# Patient Record
Sex: Male | Born: 1975 | Race: White | Hispanic: No | Smoking: Current some day smoker
Health system: Southern US, Community
[De-identification: ages and names within clinical notes are randomized; demographics above are authoritative.]

## PROBLEM LIST (undated history)

## (undated) DIAGNOSIS — Z789 Other specified health status: Secondary | ICD-10-CM

## (undated) HISTORY — PX: NO PAST SURGERIES: SHX2092

---

## 2019-03-08 ENCOUNTER — Other Ambulatory Visit: Payer: Self-pay | Admitting: Orthopedic Surgery

## 2019-03-08 DIAGNOSIS — S52122A Displaced fracture of head of left radius, initial encounter for closed fracture: Secondary | ICD-10-CM

## 2019-03-09 ENCOUNTER — Encounter (HOSPITAL_COMMUNITY): Payer: Self-pay | Admitting: Orthopedic Surgery

## 2019-03-09 ENCOUNTER — Ambulatory Visit
Admission: RE | Admit: 2019-03-09 | Discharge: 2019-03-09 | Disposition: A | Discharge status: Home / Self Care | Payer: Self-pay | Source: Ambulatory Visit | Attending: Orthopedic Surgery | Admitting: Orthopedic Surgery

## 2019-03-09 ENCOUNTER — Ambulatory Visit
Admission: RE | Admit: 2019-03-09 | Discharge: 2019-03-09 | Disposition: A | Discharge status: Home / Self Care | Payer: Worker's Compensation | Source: Ambulatory Visit | Attending: Orthopedic Surgery | Admitting: Orthopedic Surgery

## 2019-03-09 ENCOUNTER — Other Ambulatory Visit (HOSPITAL_COMMUNITY)
Admission: RE | Admit: 2019-03-09 | Discharge: 2019-03-09 | Disposition: A | Discharge status: Home / Self Care | Payer: Worker's Compensation | Source: Ambulatory Visit | Attending: Orthopedic Surgery | Admitting: Orthopedic Surgery

## 2019-03-09 ENCOUNTER — Other Ambulatory Visit: Payer: Self-pay

## 2019-03-09 DIAGNOSIS — S52122A Displaced fracture of head of left radius, initial encounter for closed fracture: Secondary | ICD-10-CM

## 2019-03-09 LAB — SARS CORONAVIRUS 2 (TAT 6-24 HRS): SARS Coronavirus 2: NEGATIVE

## 2019-03-09 NOTE — Progress Notes (Signed)
Geoffrey Fields denies chest pain or shortness of breath. Patient had Covid test today and it is pending.  I instructed patient that he may have a light breakfast before 0830- dry toast - noting greasy and that he may have clear liquids until 1200.  I went over what clear liquids, patient said he drinks soda.

## 2019-03-10 ENCOUNTER — Inpatient Hospital Stay (HOSPITAL_COMMUNITY): Payer: Self-pay

## 2019-03-10 ENCOUNTER — Inpatient Hospital Stay (HOSPITAL_COMMUNITY)
Admission: RE | Admit: 2019-03-10 | Discharge: 2019-03-13 | DRG: 500 | Disposition: A | Discharge status: Home / Self Care | Payer: Worker's Compensation | Attending: Orthopedic Surgery | Admitting: Orthopedic Surgery

## 2019-03-10 ENCOUNTER — Inpatient Hospital Stay (HOSPITAL_COMMUNITY): Payer: Worker's Compensation | Attending: Anesthesiology

## 2019-03-10 ENCOUNTER — Ambulatory Visit (HOSPITAL_COMMUNITY): Payer: Worker's Compensation | Admitting: Anesthesiology

## 2019-03-10 ENCOUNTER — Observation Stay (HOSPITAL_COMMUNITY): Payer: Worker's Compensation

## 2019-03-10 ENCOUNTER — Encounter (HOSPITAL_COMMUNITY)
Admission: RE | Disposition: A | Discharge status: Home / Self Care | Payer: Self-pay | Source: Home / Self Care | Attending: Orthopedic Surgery

## 2019-03-10 ENCOUNTER — Encounter (HOSPITAL_COMMUNITY): Payer: Self-pay | Admitting: Orthopedic Surgery

## 2019-03-10 DIAGNOSIS — Z01818 Encounter for other preprocedural examination: Secondary | ICD-10-CM

## 2019-03-10 DIAGNOSIS — J9601 Acute respiratory failure with hypoxia: Secondary | ICD-10-CM | POA: Diagnosis not present

## 2019-03-10 DIAGNOSIS — F17210 Nicotine dependence, cigarettes, uncomplicated: Secondary | ICD-10-CM | POA: Diagnosis present

## 2019-03-10 DIAGNOSIS — S42402A Unspecified fracture of lower end of left humerus, initial encounter for closed fracture: Secondary | ICD-10-CM | POA: Diagnosis present

## 2019-03-10 DIAGNOSIS — S5332XA Traumatic rupture of left ulnar collateral ligament, initial encounter: Secondary | ICD-10-CM | POA: Diagnosis present

## 2019-03-10 DIAGNOSIS — W19XXXA Unspecified fall, initial encounter: Secondary | ICD-10-CM | POA: Diagnosis present

## 2019-03-10 DIAGNOSIS — R918 Other nonspecific abnormal finding of lung field: Secondary | ICD-10-CM

## 2019-03-10 DIAGNOSIS — Y9389 Activity, other specified: Secondary | ICD-10-CM

## 2019-03-10 DIAGNOSIS — S42452A Displaced fracture of lateral condyle of left humerus, initial encounter for closed fracture: Principal | ICD-10-CM | POA: Diagnosis present

## 2019-03-10 DIAGNOSIS — I959 Hypotension, unspecified: Secondary | ICD-10-CM | POA: Diagnosis not present

## 2019-03-10 DIAGNOSIS — Z20828 Contact with and (suspected) exposure to other viral communicable diseases: Secondary | ICD-10-CM | POA: Diagnosis present

## 2019-03-10 DIAGNOSIS — S52122A Displaced fracture of head of left radius, initial encounter for closed fracture: Secondary | ICD-10-CM | POA: Diagnosis present

## 2019-03-10 DIAGNOSIS — J811 Chronic pulmonary edema: Secondary | ICD-10-CM

## 2019-03-10 DIAGNOSIS — Z880 Allergy status to penicillin: Secondary | ICD-10-CM

## 2019-03-10 DIAGNOSIS — J9602 Acute respiratory failure with hypercapnia: Secondary | ICD-10-CM | POA: Diagnosis not present

## 2019-03-10 DIAGNOSIS — Y990 Civilian activity done for income or pay: Secondary | ICD-10-CM

## 2019-03-10 DIAGNOSIS — J988 Other specified respiratory disorders: Secondary | ICD-10-CM | POA: Diagnosis present

## 2019-03-10 DIAGNOSIS — Z8614 Personal history of Methicillin resistant Staphylococcus aureus infection: Secondary | ICD-10-CM

## 2019-03-10 DIAGNOSIS — S53022A Posterior subluxation of left radial head, initial encounter: Secondary | ICD-10-CM | POA: Diagnosis present

## 2019-03-10 DIAGNOSIS — Y929 Unspecified place or not applicable: Secondary | ICD-10-CM

## 2019-03-10 DIAGNOSIS — J9690 Respiratory failure, unspecified, unspecified whether with hypoxia or hypercapnia: Secondary | ICD-10-CM | POA: Diagnosis present

## 2019-03-10 DIAGNOSIS — J95821 Acute postprocedural respiratory failure: Secondary | ICD-10-CM | POA: Diagnosis not present

## 2019-03-10 HISTORY — PX: RADIAL HEAD ARTHROPLASTY: SHX6044

## 2019-03-10 HISTORY — DX: Other specified health status: Z78.9

## 2019-03-10 LAB — CBC
HCT: 40.9 % (ref 39.0–52.0)
HCT: 38 % — ABNORMAL LOW (ref 39.0–52.0)
Hemoglobin: 14.2 g/dL (ref 13.0–17.0)
Hemoglobin: 12.6 g/dL — ABNORMAL LOW (ref 13.0–17.0)
MCH: 30.8 pg (ref 26.0–34.0)
MCH: 30.2 pg (ref 26.0–34.0)
MCHC: 34.7 g/dL (ref 30.0–36.0)
MCHC: 33.2 g/dL (ref 30.0–36.0)
MCV: 88.7 fL (ref 80.0–100.0)
MCV: 91.1 fL (ref 80.0–100.0)
Platelets: 253 10*3/uL (ref 150–400)
Platelets: 234 10*3/uL (ref 150–400)
RBC: 4.61 MIL/uL (ref 4.22–5.81)
RBC: 4.17 MIL/uL — ABNORMAL LOW (ref 4.22–5.81)
RDW: 12.9 % (ref 11.5–15.5)
RDW: 12.8 % (ref 11.5–15.5)
WBC: 11.1 10*3/uL — ABNORMAL HIGH (ref 4.0–10.5)
WBC: 15.5 10*3/uL — ABNORMAL HIGH (ref 4.0–10.5)
nRBC: 0 % (ref 0.0–0.2)
nRBC: 0 % (ref 0.0–0.2)

## 2019-03-10 LAB — POCT I-STAT 7, (LYTES, BLD GAS, ICA,H+H)
Acid-base deficit: 2 mmol/L (ref 0.0–2.0)
Bicarbonate: 26.9 mmol/L (ref 20.0–28.0)
Calcium, Ion: 1.18 mmol/L (ref 1.15–1.40)
HCT: 39 % (ref 39.0–52.0)
Hemoglobin: 13.3 g/dL (ref 13.0–17.0)
O2 Saturation: 76 %
Potassium: 4.3 mmol/L (ref 3.5–5.1)
Sodium: 134 mmol/L — ABNORMAL LOW (ref 135–145)
TCO2: 29 mmol/L (ref 22–32)
pCO2 arterial: 66.1 mmHg (ref 32.0–48.0)
pH, Arterial: 7.218 — ABNORMAL LOW (ref 7.350–7.450)
pO2, Arterial: 51 mmHg — ABNORMAL LOW (ref 83.0–108.0)

## 2019-03-10 LAB — HIV ANTIBODY (ROUTINE TESTING W REFLEX): HIV Screen 4th Generation wRfx: NONREACTIVE

## 2019-03-10 LAB — CREATININE, SERUM
Creatinine, Ser: 1.03 mg/dL (ref 0.61–1.24)
GFR calc Af Amer: >60 mL/min (ref >60)
GFR calc non Af Amer: >60 mL/min (ref >60)

## 2019-03-10 SURGERY — ARTHROPLASTY, RADIUS, HEAD
Anesthesia: General | Laterality: Left

## 2019-03-10 MED ORDER — ACETAMINOPHEN 160 MG/5ML PO SOLN: 325.0000 mg | ORAL | Status: DC | PRN

## 2019-03-10 MED ORDER — ONDANSETRON HCL 4 MG/2ML IJ SOLN
INTRAMUSCULAR | Status: DC | PRN
  Administered 2019-03-10: 4 mg via INTRAVENOUS

## 2019-03-10 MED ORDER — DEXMEDETOMIDINE HCL IN NACL 80 MCG/20ML IV SOLN
INTRAVENOUS | Status: AC
  Filled 2019-03-10: qty 20

## 2019-03-10 MED ORDER — LACTATED RINGERS IV SOLN
INTRAVENOUS | Status: DC
  Administered 2019-03-10 (×3): via INTRAVENOUS

## 2019-03-10 MED ORDER — CEFAZOLIN SODIUM-DEXTROSE 1-4 GM/50ML-% IV SOLN
1.0000 g | Freq: Three times a day (TID) | INTRAVENOUS | Status: DC
  Administered 2019-03-11 – 2019-03-12 (×4): 1 g via INTRAVENOUS
  Filled 2019-03-10 (×6): qty 50

## 2019-03-10 MED ORDER — PROPOFOL 10 MG/ML IV BOLUS
INTRAVENOUS | Status: DC | PRN
  Administered 2019-03-10: 50 mg via INTRAVENOUS
  Administered 2019-03-10: 200 mg via INTRAVENOUS

## 2019-03-10 MED ORDER — ALBUMIN HUMAN 5 % IV SOLN
INTRAVENOUS | Status: AC
  Filled 2019-03-10: qty 250

## 2019-03-10 MED ORDER — MIDAZOLAM HCL 2 MG/2ML IJ SOLN
INTRAMUSCULAR | Status: AC
  Filled 2019-03-10: qty 2

## 2019-03-10 MED ORDER — OXYCODONE HCL 5 MG PO TABS
10.0000 mg | ORAL_TABLET | ORAL | Status: DC | PRN
  Administered 2019-03-11: 20:00:00 10 mg via ORAL
  Administered 2019-03-12: 15 mg via ORAL
  Administered 2019-03-12: 01:00:00 10 mg via ORAL
  Administered 2019-03-12 – 2019-03-13 (×2): 15 mg via ORAL
  Filled 2019-03-10: qty 2
  Filled 2019-03-10: qty 3
  Filled 2019-03-10: qty 2
  Filled 2019-03-10: qty 3
  Filled 2019-03-10: qty 2
  Filled 2019-03-10: qty 3

## 2019-03-10 MED ORDER — LIDOCAINE 2% (20 MG/ML) 5 ML SYRINGE
INTRAMUSCULAR | Status: DC | PRN
  Administered 2019-03-10: 80 mg via INTRAVENOUS

## 2019-03-10 MED ORDER — 0.9 % SODIUM CHLORIDE (POUR BTL) OPTIME
TOPICAL | Status: DC | PRN
  Administered 2019-03-10: 16:00:00 1000 mL

## 2019-03-10 MED ORDER — METHOCARBAMOL 500 MG PO TABS: 500.0000 mg | ORAL_TABLET | Freq: Four times a day (QID) | ORAL | Status: DC | PRN

## 2019-03-10 MED ORDER — EPINEPHRINE 1 MG/10ML IJ SOSY
PREFILLED_SYRINGE | INTRAMUSCULAR | Status: DC | PRN
  Administered 2019-03-10 (×2): .01 mg via INTRAVENOUS

## 2019-03-10 MED ORDER — FUROSEMIDE 10 MG/ML IJ SOLN
INTRAMUSCULAR | Status: AC
  Filled 2019-03-10: qty 4

## 2019-03-10 MED ORDER — BUPIVACAINE HCL (PF) 0.25 % IJ SOLN
INTRAMUSCULAR | Status: AC
  Filled 2019-03-10: qty 30

## 2019-03-10 MED ORDER — NOREPINEPHRINE 4 MG/250ML-% IV SOLN
0.0000 ug/min | INTRAVENOUS | Status: DC
  Administered 2019-03-10: 2 ug/min via INTRAVENOUS
  Filled 2019-03-10: qty 250

## 2019-03-10 MED ORDER — ACETAMINOPHEN 325 MG PO TABS: 325.0000 mg | ORAL_TABLET | ORAL | Status: DC | PRN

## 2019-03-10 MED ORDER — OXYCODONE HCL 5 MG/5ML PO SOLN: 5.0000 mg | Freq: Once | ORAL | Status: DC | PRN

## 2019-03-10 MED ORDER — OXYCODONE HCL 5 MG PO TABS: 5.0000 mg | ORAL_TABLET | Freq: Once | ORAL | Status: DC | PRN

## 2019-03-10 MED ORDER — OXYCODONE HCL 5 MG PO TABS
5.0000 mg | ORAL_TABLET | ORAL | Status: DC | PRN
  Administered 2019-03-11 – 2019-03-13 (×4): 10 mg via ORAL
  Filled 2019-03-10 (×3): qty 2

## 2019-03-10 MED ORDER — ONDANSETRON HCL 4 MG/2ML IJ SOLN
4.0000 mg | Freq: Four times a day (QID) | INTRAMUSCULAR | Status: DC | PRN
  Administered 2019-03-11: 16:00:00 4 mg via INTRAVENOUS
  Filled 2019-03-10: qty 2

## 2019-03-10 MED ORDER — ALBUTEROL SULFATE HFA 108 (90 BASE) MCG/ACT IN AERS
INHALATION_SPRAY | RESPIRATORY_TRACT | Status: DC | PRN
  Administered 2019-03-10 (×2): 3 via RESPIRATORY_TRACT
  Administered 2019-03-10: 6 via RESPIRATORY_TRACT

## 2019-03-10 MED ORDER — PROPOFOL 10 MG/ML IV BOLUS
INTRAVENOUS | Status: AC
  Filled 2019-03-10: qty 20

## 2019-03-10 MED ORDER — ACETAMINOPHEN 325 MG PO TABS
325.0000 mg | ORAL_TABLET | Freq: Four times a day (QID) | ORAL | Status: DC | PRN
  Administered 2019-03-11: 325 mg via ORAL
  Administered 2019-03-12 – 2019-03-13 (×5): 650 mg via ORAL
  Filled 2019-03-10 (×3): qty 2
  Filled 2019-03-10: qty 1
  Filled 2019-03-10 (×3): qty 2

## 2019-03-10 MED ORDER — IPRATROPIUM-ALBUTEROL 0.5-2.5 (3) MG/3ML IN SOLN
3.0000 mL | RESPIRATORY_TRACT | Status: DC
  Administered 2019-03-10: 3 mL via RESPIRATORY_TRACT

## 2019-03-10 MED ORDER — MEPERIDINE HCL 25 MG/ML IJ SOLN: 6.2500 mg | INTRAMUSCULAR | Status: DC | PRN

## 2019-03-10 MED ORDER — FENTANYL CITRATE (PF) 100 MCG/2ML IJ SOLN
INTRAMUSCULAR | Status: AC
  Administered 2019-03-10: 50 ug via INTRAVENOUS
  Filled 2019-03-10: qty 2

## 2019-03-10 MED ORDER — PROMETHAZINE HCL 12.5 MG RE SUPP
12.5000 mg | Freq: Four times a day (QID) | RECTAL | Status: DC | PRN
  Filled 2019-03-10: qty 1

## 2019-03-10 MED ORDER — IPRATROPIUM-ALBUTEROL 0.5-2.5 (3) MG/3ML IN SOLN
RESPIRATORY_TRACT | Status: AC
  Filled 2019-03-10: qty 3

## 2019-03-10 MED ORDER — PROPOFOL 1000 MG/100ML IV EMUL
0.0000 ug/kg/min | INTRAVENOUS | Status: DC
  Administered 2019-03-10: 10 ug/kg/min via INTRAVENOUS
  Administered 2019-03-11: 11.374 ug/kg/min via INTRAVENOUS
  Filled 2019-03-10 (×2): qty 100

## 2019-03-10 MED ORDER — DEXMEDETOMIDINE HCL IN NACL 200 MCG/50ML IV SOLN
INTRAVENOUS | Status: DC | PRN
  Administered 2019-03-10: .7 ug/kg/h via INTRAVENOUS

## 2019-03-10 MED ORDER — FENTANYL CITRATE (PF) 100 MCG/2ML IJ SOLN
25.0000 ug | INTRAMUSCULAR | Status: DC | PRN
  Administered 2019-03-10: 20:00:00 50 ug via INTRAVENOUS

## 2019-03-10 MED ORDER — ESMOLOL HCL 100 MG/10ML IV SOLN
INTRAVENOUS | Status: DC | PRN
  Administered 2019-03-10: 30 ug via INTRAVENOUS
  Administered 2019-03-10: 20 ug via INTRAVENOUS

## 2019-03-10 MED ORDER — HYDROMORPHONE HCL 1 MG/ML IJ SOLN: 0.5000 mg | INTRAMUSCULAR | Status: DC | PRN

## 2019-03-10 MED ORDER — ACETAMINOPHEN 500 MG PO TABS
1000.0000 mg | ORAL_TABLET | Freq: Four times a day (QID) | ORAL | Status: AC
  Administered 2019-03-11: 12:00:00 1000 mg via ORAL
  Filled 2019-03-10: qty 2

## 2019-03-10 MED ORDER — MIDAZOLAM HCL 5 MG/5ML IJ SOLN
INTRAMUSCULAR | Status: DC | PRN
  Administered 2019-03-10: 2 mg via INTRAVENOUS

## 2019-03-10 MED ORDER — FAMOTIDINE IN NACL 20-0.9 MG/50ML-% IV SOLN
20.0000 mg | Freq: Two times a day (BID) | INTRAVENOUS | Status: DC
  Administered 2019-03-10 – 2019-03-12 (×4): 20 mg via INTRAVENOUS
  Filled 2019-03-10 (×5): qty 50

## 2019-03-10 MED ORDER — ALBUMIN HUMAN 5 % IV SOLN
12.5000 g | Freq: Once | INTRAVENOUS | Status: AC
  Administered 2019-03-10: 22:00:00 12.5 g via INTRAVENOUS

## 2019-03-10 MED ORDER — ALPRAZOLAM 0.5 MG PO TABS
0.5000 mg | ORAL_TABLET | Freq: Four times a day (QID) | ORAL | Status: DC | PRN
  Administered 2019-03-11: 16:00:00 0.5 mg via ORAL
  Filled 2019-03-10: qty 1

## 2019-03-10 MED ORDER — VITAMIN C 500 MG PO TABS
1000.0000 mg | ORAL_TABLET | Freq: Every day | ORAL | Status: DC
  Administered 2019-03-12 – 2019-03-13 (×2): 1000 mg via ORAL
  Filled 2019-03-10: qty 2

## 2019-03-10 MED ORDER — DEXAMETHASONE SODIUM PHOSPHATE 10 MG/ML IJ SOLN
INTRAMUSCULAR | Status: DC | PRN
  Administered 2019-03-10: 10 mg via INTRAVENOUS

## 2019-03-10 MED ORDER — METOPROLOL TARTRATE 5 MG/5ML IV SOLN
INTRAVENOUS | Status: DC | PRN
  Administered 2019-03-10: 2.5 mg via INTRAVENOUS

## 2019-03-10 MED ORDER — BUPIVACAINE HCL (PF) 0.25 % IJ SOLN
INTRAMUSCULAR | Status: DC | PRN
  Administered 2019-03-10: 30 mL

## 2019-03-10 MED ORDER — ROCURONIUM BROMIDE 10 MG/ML (PF) SYRINGE
PREFILLED_SYRINGE | INTRAVENOUS | Status: DC | PRN
  Administered 2019-03-10: 50 mg via INTRAVENOUS
  Administered 2019-03-10: 20 mg via INTRAVENOUS
  Administered 2019-03-10: 30 mg via INTRAVENOUS

## 2019-03-10 MED ORDER — FENTANYL CITRATE (PF) 100 MCG/2ML IJ SOLN
50.0000 ug | INTRAMUSCULAR | Status: DC | PRN
  Administered 2019-03-11: 08:00:00 50 ug via INTRAVENOUS
  Filled 2019-03-10: qty 2

## 2019-03-10 MED ORDER — ONDANSETRON HCL 4 MG PO TABS: 4.0000 mg | ORAL_TABLET | Freq: Four times a day (QID) | ORAL | Status: DC | PRN

## 2019-03-10 MED ORDER — ALBUTEROL SULFATE HFA 108 (90 BASE) MCG/ACT IN AERS
INHALATION_SPRAY | RESPIRATORY_TRACT | Status: AC
  Filled 2019-03-10: qty 6.7

## 2019-03-10 MED ORDER — DEXMEDETOMIDINE HCL IN NACL 400 MCG/100ML IV SOLN
0.4000 ug/kg/h | INTRAVENOUS | Status: DC
  Administered 2019-03-10: 1.2 ug/kg/h via INTRAVENOUS
  Filled 2019-03-10: qty 100

## 2019-03-10 MED ORDER — FENTANYL CITRATE (PF) 100 MCG/2ML IJ SOLN
INTRAMUSCULAR | Status: DC | PRN
  Administered 2019-03-10: 100 ug via INTRAVENOUS
  Administered 2019-03-10 (×2): 50 ug via INTRAVENOUS

## 2019-03-10 MED ORDER — ENOXAPARIN SODIUM 60 MG/0.6ML ~~LOC~~ SOLN
50.0000 mg | SUBCUTANEOUS | Status: DC
  Administered 2019-03-11 – 2019-03-13 (×3): 50 mg via SUBCUTANEOUS
  Filled 2019-03-10 (×2): qty 0.5
  Filled 2019-03-10 (×3): qty 0.6
  Filled 2019-03-10: qty 0.5

## 2019-03-10 MED ORDER — SODIUM CHLORIDE (PF) 0.9 % IJ SOLN
INTRAMUSCULAR | Status: AC
  Filled 2019-03-10: qty 10

## 2019-03-10 MED ORDER — FENTANYL CITRATE (PF) 250 MCG/5ML IJ SOLN
INTRAMUSCULAR | Status: AC
  Filled 2019-03-10: qty 5

## 2019-03-10 MED ORDER — PHENYLEPHRINE 40 MCG/ML (10ML) SYRINGE FOR IV PUSH (FOR BLOOD PRESSURE SUPPORT)
PREFILLED_SYRINGE | INTRAVENOUS | Status: DC | PRN
  Administered 2019-03-10: 80 ug via INTRAVENOUS

## 2019-03-10 MED ORDER — SUCCINYLCHOLINE CHLORIDE 200 MG/10ML IV SOSY
PREFILLED_SYRINGE | INTRAVENOUS | Status: DC | PRN
  Administered 2019-03-10: 110 mg via INTRAVENOUS

## 2019-03-10 MED ORDER — CHLORHEXIDINE GLUCONATE 4 % EX LIQD: 60.0000 mL | Freq: Once | CUTANEOUS | Status: DC

## 2019-03-10 MED ORDER — FUROSEMIDE 10 MG/ML IJ SOLN
20.0000 mg | Freq: Once | INTRAMUSCULAR | Status: AC
  Administered 2019-03-10: 20 mg via INTRAVENOUS
  Filled 2019-03-10: qty 2

## 2019-03-10 MED ORDER — FENTANYL CITRATE (PF) 100 MCG/2ML IJ SOLN
50.0000 ug | INTRAMUSCULAR | Status: DC | PRN
  Administered 2019-03-11: 06:00:00 100 ug via INTRAVENOUS
  Administered 2019-03-11 (×2): 50 ug via INTRAVENOUS
  Administered 2019-03-11 (×2): 100 ug via INTRAVENOUS
  Filled 2019-03-10 (×5): qty 2

## 2019-03-10 MED ORDER — DOCUSATE SODIUM 100 MG PO CAPS
100.0000 mg | ORAL_CAPSULE | Freq: Two times a day (BID) | ORAL | Status: DC
  Administered 2019-03-11 – 2019-03-13 (×4): 100 mg via ORAL
  Filled 2019-03-10 (×4): qty 1

## 2019-03-10 MED ORDER — CEFAZOLIN SODIUM-DEXTROSE 2-4 GM/100ML-% IV SOLN
2.0000 g | INTRAVENOUS | Status: AC
  Administered 2019-03-10: 2 g via INTRAVENOUS
  Filled 2019-03-10: qty 100

## 2019-03-10 MED ORDER — CEFAZOLIN SODIUM-DEXTROSE 1-4 GM/50ML-% IV SOLN
1.0000 g | INTRAVENOUS | Status: AC
  Administered 2019-03-11: 1 g via INTRAVENOUS
  Filled 2019-03-10: qty 50

## 2019-03-10 MED ORDER — LACTATED RINGERS IV SOLN
INTRAVENOUS | Status: DC
  Administered 2019-03-11 – 2019-03-13 (×3): via INTRAVENOUS

## 2019-03-10 MED ORDER — FAMOTIDINE 20 MG PO TABS
20.0000 mg | ORAL_TABLET | Freq: Two times a day (BID) | ORAL | Status: DC | PRN
  Filled 2019-03-10 (×2): qty 1

## 2019-03-10 MED ORDER — EPINEPHRINE 1 MG/10ML IJ SOSY
PREFILLED_SYRINGE | INTRAMUSCULAR | Status: AC
  Filled 2019-03-10: qty 10

## 2019-03-10 MED ORDER — METHOCARBAMOL 1000 MG/10ML IJ SOLN
500.0000 mg | Freq: Four times a day (QID) | INTRAVENOUS | Status: DC | PRN
  Filled 2019-03-10: qty 5

## 2019-03-10 MED ORDER — ONDANSETRON HCL 4 MG/2ML IJ SOLN: 4.0000 mg | Freq: Once | INTRAMUSCULAR | Status: DC | PRN

## 2019-03-10 SURGICAL SUPPLY — 61 items
BLADE LONG MED 31MMX9MM (MISCELLANEOUS) ×1
BLADE LONG MED 31X9 (MISCELLANEOUS) ×2 IMPLANT
BNDG COHESIVE 4X5 TAN STRL (GAUZE/BANDAGES/DRESSINGS) ×3 IMPLANT
BNDG ELASTIC 3X5.8 VLCR STR LF (GAUZE/BANDAGES/DRESSINGS) IMPLANT
BNDG ELASTIC 4X5.8 VLCR STR LF (GAUZE/BANDAGES/DRESSINGS) ×12 IMPLANT
BNDG ESMARK 4X9 LF (GAUZE/BANDAGES/DRESSINGS) ×3 IMPLANT
BNDG GAUZE ELAST 4 BULKY (GAUZE/BANDAGES/DRESSINGS) ×6 IMPLANT
BNDG STRETCH 4X75 NS LF (GAUZE/BANDAGES/DRESSINGS) ×9 IMPLANT
CORD BIPOLAR FORCEPS 12FT (ELECTRODE) ×3 IMPLANT
COVER SURGICAL LIGHT HANDLE (MISCELLANEOUS) ×3 IMPLANT
COVER WAND RF STERILE (DRAPES) IMPLANT
CUFF TOURN SGL QUICK 18X4 (TOURNIQUET CUFF) IMPLANT
CUFF TOURN SGL QUICK 24 (TOURNIQUET CUFF) ×2
CUFF TRNQT CYL 24X4X16.5-23 (TOURNIQUET CUFF) ×1 IMPLANT
DRAPE INCISE IOBAN 66X45 STRL (DRAPES) ×3 IMPLANT
DRAPE OEC MINIVIEW 54X84 (DRAPES) ×3 IMPLANT
DRSG EMULSION OIL 3X3 NADH (GAUZE/BANDAGES/DRESSINGS) ×6 IMPLANT
GAUZE SPONGE 4X4 12PLY STRL (GAUZE/BANDAGES/DRESSINGS) ×3 IMPLANT
GAUZE XEROFORM 1X8 LF (GAUZE/BANDAGES/DRESSINGS) IMPLANT
GAUZE XEROFORM 5X9 LF (GAUZE/BANDAGES/DRESSINGS) ×3 IMPLANT
GLOVE BIOGEL M 8.0 STRL (GLOVE) ×3 IMPLANT
GLOVE SS BIOGEL STRL SZ 8 (GLOVE) ×1 IMPLANT
GLOVE SUPERSENSE BIOGEL SZ 8 (GLOVE) ×2
GOWN STRL REUS W/ TWL LRG LVL3 (GOWN DISPOSABLE) ×2 IMPLANT
GOWN STRL REUS W/ TWL XL LVL3 (GOWN DISPOSABLE) ×3 IMPLANT
GOWN STRL REUS W/TWL LRG LVL3 (GOWN DISPOSABLE) ×4
GOWN STRL REUS W/TWL XL LVL3 (GOWN DISPOSABLE) ×6
HEAD RADIAL EXPLOR 14X24MM (Head) ×3 IMPLANT
IMPL STEM WITH SCREW 8X29 (Stem) ×1 IMPLANT
IMPL SWIVELOCK 3.5X13.5 (Anchor) ×1 IMPLANT
IMPLANT STEM WITH SCREW 8X29 (Stem) ×3 IMPLANT
IMPLANT SWIVELOCK 3.5X13.5 (Anchor) ×3 IMPLANT
KIT BASIN OR (CUSTOM PROCEDURE TRAY) ×3 IMPLANT
KIT SWIVELOCK DX 3.5X13.5 DISP (KITS) ×3 IMPLANT
KIT TURNOVER KIT B (KITS) ×3 IMPLANT
LOOP VESSEL MAXI BLUE (MISCELLANEOUS) ×3 IMPLANT
MANIFOLD NEPTUNE II (INSTRUMENTS) IMPLANT
NDL SUT 6 .5 CRC .975X.05 MAYO (NEEDLE) ×1 IMPLANT
NEEDLE HYPO 25GX1X1/2 BEV (NEEDLE) IMPLANT
NEEDLE MAYO TAPER (NEEDLE) ×2
NS IRRIG 1000ML POUR BTL (IV SOLUTION) ×3 IMPLANT
PACK ORTHO EXTREMITY (CUSTOM PROCEDURE TRAY) ×3 IMPLANT
PAD ARMBOARD 7.5X6 YLW CONV (MISCELLANEOUS) ×3 IMPLANT
PAD CAST 4YDX4 CTTN HI CHSV (CAST SUPPLIES) ×3 IMPLANT
PADDING CAST COTTON 4X4 STRL (CAST SUPPLIES) ×6
SOL PREP POV-IOD 4OZ 10% (MISCELLANEOUS) ×3 IMPLANT
SPLINT FIBERGLASS 3X35 (CAST SUPPLIES) ×3 IMPLANT
SPLINT FIBERGLASS 4X30 (CAST SUPPLIES) ×3 IMPLANT
SUT FIBERWIRE #2 38 T-5 BLUE (SUTURE) ×9
SUT PROLENE 3 0 PS 2 (SUTURE) ×21 IMPLANT
SUT PROLENE 4 0 PS 2 18 (SUTURE) IMPLANT
SUT VIC AB 2-0 CT1 27 (SUTURE)
SUT VIC AB 2-0 CT1 TAPERPNT 27 (SUTURE) IMPLANT
SUTURE FIBERWR #2 38 T-5 BLUE (SUTURE) ×3 IMPLANT
SYR CONTROL 10ML LL (SYRINGE) IMPLANT
TOWEL GREEN STERILE (TOWEL DISPOSABLE) ×6 IMPLANT
TOWEL GREEN STERILE FF (TOWEL DISPOSABLE) ×3 IMPLANT
TUBE CONNECTING 12'X1/4 (SUCTIONS)
TUBE CONNECTING 12X1/4 (SUCTIONS) IMPLANT
UNDERPAD 30X30 (UNDERPADS AND DIAPERS) ×3 IMPLANT
WATER STERILE IRR 1000ML POUR (IV SOLUTION) IMPLANT

## 2019-03-10 NOTE — Op Note (Signed)
Operative note 03/10/2019  Dominica Severin MD.  Preoperative diagnosis left elbow fracture subluxation/dislocation with comminuted radial head fracture and posterior capitellum avulsion injury with lateral ulnar collateral ligament tear  Postop diagnosis the same  Operative procedure #1 left elbow evaluation under anesthesia #2 left elbow radial head resection and radial head arthroplasty using a Biomet 8 mm x 28 mm in length stem and a 14 x 24 mm implant with locking screw #3 complex lateral ulnar collateral ligament repair left elbow #4 excision posterior capitellum chip fragments secondary to capitellum fracture #5 stress radiography and 6 view radiographic series performed examined and interpreted by myself left elbow  Anesthesia block with general anesthetic  Surgeon Dominica Severin  Estimated blood loss minimal  Tourniquet time just over an hour  Indications for procedure: This patient is a pleasant 43 year old male who had an on-the-job injury.  He had a violent fall and complains of pain in his left elbow.  He was seen in my office and was reduced.  He has comminuted radial head fracture with posterior capitellum fracture and lateral ulnar collateral ligament insufficiency.  I feel he had a complete dislocation most likely and partially reduced.  I reduced the subluxation in the office and obtained a CT scan which reveals the abnormalities.  He presents for surgical reconstruction.  Operative procedure: Patient was seen by myself and anesthesia he was counseled extensively in the holding area.  Preoperative block was placed.  He has a penicillin allergy which is a rash but tolerated Ancef well.  He was taken to the operative theater underwent preoperative Ancef and I then supervised and performed the evaluation under anesthesia and prep and drape myself with Hibiclens scrub followed by 10-minute surgical Betadine scrub and paint we carefully handled the arm.  Once this was complete the  sterile field was secured and timeout was observed.  The operation commenced with an extensile lateral incision about the elbow.  I used the interval of Kocher to wean the anconeus and ECU after initial skin incision and dissection.  I made sure the planes were carefully protected.  The patient had obvious large amount of damage to the area.  At this juncture you could visually see the lateral ulnar collateral ligament tear.  The radial head was fragmented and there were portions of the radial head and posterior capitellum region chip fractures in the posterior elbow joint.  These fragments were removed.  Unfortunately he had some extension into the radial shaft but a fairly reasonable stability in terms of the shaft for radial head arthroplasty.  He was not a candidate unfortunately for direct ORIF of the radial head and neck.  The comminuted pieces were removed at this time I then placed an oscillating saw followed by planar about the radial shaft.  I did place the arm in pronation and protected the radial nerve at all times.  I irrigated copiously.  The patient's coronoid was intact and the anterior capsule was torn.  This was notable and the area irrigated.  I then trialed the patient to a size 8 mm x 28 mm in length stem.  I made sure not to [facility] the joint with trial prosthetic devices and make sure looked at this under x-ray.  We settled on a 8 mm x 28 mm Biomet stem with a 14 x 24 mm head for the implant.  This was placed in standard fashion.  I made sure that this was not overstuffed and secured the screw in the area.  Patient tolerated this nicely.  Following this we then very carefully and cautiously placed patient through range of motion took x-rays which looked excellent.  I should note that prior to the implant placement I placed a #2 FiberWire in a modified Krakw stitch in the elbow about the lateral ulnar collateral ligament.  I then placed an additional #2 FiberWire in a  similar pattern.  This left for exiting strands for repair of the lateral ulnar collateral ligament.  With the implant and and x-rays looking good I then tensioned the repair and found the point of isometry about the capitellum region.  This looked very good in my opinion in terms of its stability and I then placed an Arthrex 3.5 push lock for purposes of a secure fit.  I checked this under x-ray as well and following securing the lateral collateral ligament repair the patient had excellent stability.  I made sure that the posterior capitellum was stable and it was and removed all bony fragments and debris from the olecranon and the anterior chamber of the elbow.  At multiple points during the operation I checked the stability as I wanted to be very precise and meticulous with the repair.  I felt the lateral repair was excellent.  There was some medial ulnar collateral ligament laxity but there is no gross instability with pronation supination and short arc flexion and extension.  He was stable to extension at 40 degrees and I did not place him beyond this.  I feel the medial side injury should heal.  I should note I made it a point to not overstuff him as the radial shaft was fairly comminuted in one particular area and thus it was not my intent to have a forceful press-fit of the implant that could possibly propagate the fracture but to make sure we had a good solid secure fit and allowed for a secondary lateral elbow stabilizer to be recreated with the radial head arthroplasty implant.  Following this I closed the fascial interval and imbricated the lateral ulnar collateral ligament repair.  This was performed without difficulty.  The tourniquet was deflated and the skin was closed with 3-0 Prolene.  Compartments were soft refill excellent and there were no complications.  He will be monitored in the recovery room and will plan for an overnight stay for IV antibiotics pain control and other  measures.  In terms of his rehabilitative efforts we will plan for immobilization the first 4 weeks followed by progressive flexion and extension in the office in a controlled fashion with bracing.  All questions have been encouraged and answered.  Roseanne Kaufman MD

## 2019-03-10 NOTE — Anesthesia Procedure Notes (Signed)
Anesthesia Regional Block: Supraclavicular block   Pre-Anesthetic Checklist: ,, timeout performed, Correct Patient, Correct Site, Correct Laterality, Correct Procedure, Correct Position, site marked, Risks and benefits discussed,  Surgical consent,  Pre-op evaluation,  At surgeon's request and post-op pain management  Laterality: Left  Prep: chloraprep       Needles:  Injection technique: Single-shot  Needle Type: Echogenic Stimulator Needle     Needle Length: 9cm  Needle Gauge: 21     Additional Needles:   Procedures:,,,, ultrasound used (permanent image in chart),,,,  Narrative:  Start time: 03/10/2019 4:00 PM End time: 03/10/2019 4:05 PM Injection made incrementally with aspirations every 5 mL.  Performed by: Personally  Anesthesiologist: Lidia Collum, MD  Additional Notes: Monitors applied. Injection made in 5cc increments. No resistance to injection. Good needle visualization. Patient tolerated procedure well.

## 2019-03-10 NOTE — H&P (Signed)
Geoffrey Fields is an 43 y.o. male.   Chief Complaint: Fracture dislocation left elbow HPI: Patient presents for definitive surgical care of his fracture dislocation left elbow.  He denies prior injury.  He had an on-the-job injury with fracture dislocation confirmed by x-ray and CT scan.  We are planning surgical reconstruction.   Patient presents for evaluation and treatment of the of their upper extremity predicament. The patient denies neck, back, chest or  abdominal pain. The patient notes that they have no lower extremity problems. The patients primary complaint is noted. We are planning surgical care pathway for the upper extremity.  Past Medical History:  Diagnosis Date  . Medical history non-contributory     Past Surgical History:  Procedure Laterality Date  . NO PAST SURGERIES      History reviewed. No pertinent family history. Social History:  reports that he has been smoking. He has never used smokeless tobacco. He reports previous alcohol use. He reports previous drug use.  Allergies:  Allergies  Allergen Reactions  . Penicillins Rash    Did it involve swelling of the face/tongue/throat, SOB, or low BP? No Did it involve sudden or severe rash/hives, skin peeling, or any reaction on the inside of your mouth or nose? No Did you need to seek medical attention at a hospital or doctor's office? No When did it last happen?Childhood If all above answers are "NO", may proceed with cephalosporin use.    No medications prior to admission.    Results for orders placed or performed during the hospital encounter of 03/09/19 (from the past 48 hour(s))  SARS CORONAVIRUS 2 (TAT 6-24 HRS) Nasopharyngeal Nasopharyngeal Swab     Status: None   Collection Time: 03/09/19  2:27 PM   Specimen: Nasopharyngeal Swab  Result Value Ref Range   SARS Coronavirus 2 NEGATIVE NEGATIVE    Comment: (NOTE) SARS-CoV-2 target nucleic acids are NOT DETECTED. The SARS-CoV-2 RNA is generally  detectable in upper and lower respiratory specimens during the acute phase of infection. Negative results do not preclude SARS-CoV-2 infection, do not rule out co-infections with other pathogens, and should not be used as the sole basis for treatment or other patient management decisions. Negative results must be combined with clinical observations, patient history, and epidemiological information. The expected result is Negative. Fact Sheet for Patients: SugarRoll.be Fact Sheet for Healthcare Providers: https://www.woods-mathews.com/ This test is not yet approved or cleared by the [place] FDA and  has been authorized for detection and/or diagnosis of SARS-CoV-2 by FDA under an Emergency Use Authorization (EUA). This EUA will remain  in effect (meaning this test can be used) for the duration of the COVID-19 declaration under Section 56 4(b)(1) of the Act, 21 U.S.C. section 360bbb-3(b)(1), unless the authorization is terminated or revoked sooner. Performed at [facility], [place] [address], [place], [place] [address]    CT ELBOW LEFT WO CONTRAST  Result Date: 03/09/2019 CLINICAL DATA:  Left elbow fracture EXAM: CT OF THE UPPER LEFT EXTREMITY WITHOUT CONTRAST TECHNIQUE: Multidetector CT imaging of the upper left extremity was performed according to the standard protocol. 3D reformatted images were created on an independent workstation. COMPARISON:  None available. FINDINGS: Bones/Joint/Cartilage Comminuted fracture involving the anterior aspect of the radial head and neck. Dominant 2.8 x 1.2 cm fracture fragment which includes approximately 40% of the radial head articular surface is displaced proximally and interposed within the radiocapitellar joint (series 1007, image 28). The overall alignment of the radiocapitellar joint is near anatomic.  There is an acute fracture of the more posterior aspect of the capitellum with a 1.6 x 0.7 cm  minimally displaced fracture fragment (series 11, image 65). Alignment of the ulna and trochlea is anatomic without dislocation. No fracture is visualized of the proximal ulna. There is a large elbow hemarthrosis with a few tiny intra-articular fracture fragments. Ligaments Suboptimally assessed by CT. Muscles and Tendons Triceps tendon and biceps tendons appear grossly intact. No focal muscular abnormality. Soft tissues Prominent soft tissue swelling and hemorrhage at the posterior aspect of the elbow. IMPRESSION: 1. Comminuted fracture involving the anterior aspect of the radial head and neck. Dominant 2.8 x 1.2 cm fracture fragment which includes approximately 40% of the radial head articular surface is displaced proximally and interposed within the radiocapitellar joint. 2. Acute fracture of the more posterior aspect of the capitellum with a 1.6 x 0.7 cm minimally displaced fracture fragment. 3. Large elbow hemarthrosis with a few tiny intra-articular fracture fragments. 4. Prominent soft tissue swelling and hemorrhage at the posterior aspect of the elbow. Electronically Signed   By: Duanne Guess M.D.   On: 03/09/2019 11:04   CT 3D INDEPENDENT WKST  Result Date: 03/09/2019 CLINICAL DATA:  Left elbow fracture EXAM: CT OF THE UPPER LEFT EXTREMITY WITHOUT CONTRAST TECHNIQUE: Multidetector CT imaging of the upper left extremity was performed according to the standard protocol. 3D reformatted images were created on an independent workstation. COMPARISON:  None available. FINDINGS: Bones/Joint/Cartilage Comminuted fracture involving the anterior aspect of the radial head and neck. Dominant 2.8 x 1.2 cm fracture fragment which includes approximately 40% of the radial head articular surface is displaced proximally and interposed within the radiocapitellar joint (series 1007, image 28). The overall alignment of the radiocapitellar joint is near anatomic. There is an acute fracture of the more posterior aspect of  the capitellum with a 1.6 x 0.7 cm minimally displaced fracture fragment (series 11, image 65). Alignment of the ulna and trochlea is anatomic without dislocation. No fracture is visualized of the proximal ulna. There is a large elbow hemarthrosis with a few tiny intra-articular fracture fragments. Ligaments Suboptimally assessed by CT. Muscles and Tendons Triceps tendon and biceps tendons appear grossly intact. No focal muscular abnormality. Soft tissues Prominent soft tissue swelling and hemorrhage at the posterior aspect of the elbow. IMPRESSION: 1. Comminuted fracture involving the anterior aspect of the radial head and neck. Dominant 2.8 x 1.2 cm fracture fragment which includes approximately 40% of the radial head articular surface is displaced proximally and interposed within the radiocapitellar joint. 2. Acute fracture of the more posterior aspect of the capitellum with a 1.6 x 0.7 cm minimally displaced fracture fragment. 3. Large elbow hemarthrosis with a few tiny intra-articular fracture fragments. 4. Prominent soft tissue swelling and hemorrhage at the posterior aspect of the elbow. Electronically Signed   By: Duanne Guess M.D.   On: 03/09/2019 11:04    Review of Systems  Constitutional: Negative.   HENT: Negative.   Eyes: Negative.   Respiratory: Negative.   Cardiovascular: Negative.     Height 5\' 11"  (1.803 m), weight 108.9 kg. Physical Exam  Fracture dislocation left elbow reduced in my office.  CT confirms the malalignment and displaced radial head and capitellar fractures.  We will plan for surgical reconstruction.  He is sensate.  He has no signs of infection or dystrophy and no signs of vascular compromise.  Fortunately he has no compartment syndrome but does have a very swollen elbow.  The patient is  alert and oriented in no acute distress. The patient complains of pain in the affected upper extremity.  The patient is noted to have a normal HEENT exam. Lung fields show equal  chest expansion and no shortness of breath. Abdomen exam is nontender without distention. Lower extremity examination does not show any fracture dislocation or blood clot symptoms. Pelvis is stable and the neck and back are stable and nontender. Assessment/Plan  We will plan for surgical reconstruction left elbow fracture subluxation with ORIF as necessary ligament repair is necessary and radial head arthroplasty.  I discussed all issues with the patient.  We are planning surgery for your upper extremity. The risk and benefits of surgery to include risk of bleeding, infection, anesthesia,  damage to normal structures and failure of the surgery to accomplish its intended goals of relieving symptoms and restoring function have been discussed in detail. With this in mind we plan to proceed. I have specifically discussed with the patient the pre-and postoperative regime and the dos and don'ts and risk and benefits in great detail. Risk and benefits of surgery also include risk of dystrophy(CRPS), chronic nerve pain, failure of the healing process to go onto completion and other inherent risks of surgery The relavent the pathophysiology of the disease/injury process, as well as the alternatives for treatment and postoperative course of action has been discussed in great detail with the patient who desires to proceed.  We will do everything in our power to help you (the patient) restore function to the upper extremity. It is a pleasure to see this patient today.   Oletta CohnWilliam M Cate Oravec III, MD 03/10/2019, 9:45 AM

## 2019-03-10 NOTE — Consult Note (Signed)
NAME:  Geoffrey Fields, MRN:  [id number], DOB:  03-Sep-1975, LOS: 0 ADMISSION DATE:  03/10/2019, CONSULTATION DATE:  03/10/19 REFERRING MD:  Dr. Amanda PeaGramig, CHIEF COMPLAINT:  Respiratory failure   Brief History   43 y.o. M who had on L elbow fracture at work on 03/08/19, he underwent surgical repair today and failed extubation, therefore PCCM consulted for ICU transfer.   History of present illness   Geoffrey Fields is a 43 y.o. M who has PMH of MRSA and tobacco use who sustained a comminuted L capitellar and radial head and neck fx fracture and subluxation with hemarthrosis on 12/9 requiring surgical repair which he underwent today.  Post-op, he failed extubation and PCCM consulted for ICU admission and ventilator management  Past Medical History  Tobacco use, MRSA   Significant Hospital Events   12/11 Admit to Orthopedic surgery, post-op PCCM consult  Consults:  PCCM  Procedures:  12/11 ETT 12/11 Surgical reconstruction of the L elbow  Significant Diagnostic Tests:  12/11  CXR>>Diffuse bilateral hazy airspace opacities. These are of unknown clinical significance and may be secondary to developing pulmonary edema or aspiration  Micro Data:  12/10 Sars-CoV-2>>negative  Antimicrobials:  Ancef 12/11-   Interim history/subjective:  Pt failed extubation post-op, transfer to ICU on full vent support  Objective   Blood pressure 134/73, pulse 99, temperature 98.2 F (36.8 C), resp. rate 20, height 5\' 11"  (1.803 m), weight 109.9 kg, SpO2 100 %.    Vent Mode: PRVC FiO2 (%):  [100 %] 100 % Set Rate:  [16 bmp] 16 bmp Vt Set:  [600 mL] 600 mL PEEP:  [8 cmH20] 8 cmH20 Plateau Pressure:  [22 cmH20] 22 cmH20   Intake/Output Summary (Last 24 hours) at 03/10/2019 2013 Last data filed at 03/10/2019 1908 Gross per 24 hour  Intake 2000 ml  Output 25 ml  Net 1975 ml   Filed Weights   03/09/19 1904 03/10/19 1424  Weight: 108.9 kg 109.9 kg    General:  Middle aged man sedated and  ventilated HEENT: MM pink/moist Neuro: opens eyes to voice CV: s1s2 rrr, no m/r/g PULM:   GI: soft, bsx4 active  Extremities: warm/dry,  edema  Skin: no rashes or lesions   Resolved Hospital Problem list     Assessment & Plan:   Acute hypoxic and hypercarbic respiratory failure  -Post-anesthesia -CXR today with possible developing infiltrates vs edema, given Lasix 20mg  in PACU P:  -follow repeat CXR tomorrow, monitor for fever, leukocytosis and signs of infection -repeat ABG pending -Maintain full vent support with SAT/SBT as tolerated -titrate Vent setting to maintain SpO2 greater than or equal to 90%. -HOB elevated 30 degrees. -Plateau pressures less than 30 cm H20.  -Bronchial hygiene and RT/bronchodilator protocol. -sedation with Precedex and Fentanyl   Comminuted L radial head and shaft fracture s/p surgical repair -management per orthopedic surgery -pain control as needed      Best practice:  Diet: NPO Pain/Anxiety/Delirium protocol (if indicated): Precedex/Fentanyl VAP protocol (if indicated): yes DVT prophylaxis: lovenox GI prophylaxis: pepcid Glucose control: n/a Mobility: bed rest Code Status: full Family Communication: per primary Disposition: ICU  Labs   CBC: Recent Labs  Lab 03/10/19 1420 03/10/19 1927  WBC 11.1*  --   HGB 14.2 13.3  HCT 40.9 39.0  MCV 88.7  --   PLT 253  --     Basic Metabolic Panel: Recent Labs  Lab 03/10/19 1927  NA 134*  K 4.3   GFR: CrCl cannot be calculated (  No successful lab value found.). Recent Labs  Lab 03/10/19 1420  WBC 11.1*    Liver Function Tests: No results for input(s): AST, ALT, ALKPHOS, BILITOT, PROT, ALBUMIN in the last 168 hours. No results for input(s): LIPASE, AMYLASE in the last 168 hours. No results for input(s): AMMONIA in the last 168 hours.  ABG    Component Value Date/Time   PHART 7.218 (L) 03/10/2019 1927   PCO2ART 66.1 (HH) 03/10/2019 1927   PO2ART 51.0 (L) 03/10/2019  1927   HCO3 26.9 03/10/2019 1927   TCO2 29 03/10/2019 1927   ACIDBASEDEF 2.0 03/10/2019 1927   O2SAT 76.0 03/10/2019 1927     Coagulation Profile: No results for input(s): INR, PROTIME in the last 168 hours.  Cardiac Enzymes: No results for input(s): CKTOTAL, CKMB, CKMBINDEX, TROPONINI in the last 168 hours.  HbA1C: No results found for: HGBA1C  CBG: No results for input(s): GLUCAP in the last 168 hours.  Review of Systems:   Unable to obtain secondary to intubated/sedated  Past Medical History  He,  has a past medical history of Medical history non-contributory.   Surgical History    Past Surgical History:  Procedure Laterality Date  . NO PAST SURGERIES       Social History   reports that he has been smoking. He has never used smokeless tobacco. He reports previous alcohol use. He reports previous drug use.   Family History   His family history is not on file.   Allergies Allergies  Allergen Reactions  . Penicillins Rash    Did it involve swelling of the face/tongue/throat, SOB, or low BP? No Did it involve sudden or severe rash/hives, skin peeling, or any reaction on the inside of your mouth or nose? No Did you need to seek medical attention at a hospital or doctor's office? No When did it last happen?Childhood If all above answers are "NO", may proceed with cephalosporin use.     Home Medications  Prior to Admission medications   Medication Sig Start Date End Date Taking? Authorizing Provider  methocarbamol (ROBAXIN) 500 MG tablet Take 500 mg by mouth every 6 (six) hours as needed for muscle spasms.   Yes [provider]  oxycodone (OXY-IR) 5 MG capsule Take 5 mg by mouth every 4 (four) hours as needed for pain.   Yes [provider]     Critical care time: 50 minutes    CRITICAL CARE Performed by: Otilio Carpen Gleason   Total critical care time: 50 minutes  Critical care time was exclusive of separately billable procedures and  treating other patients.  Critical care was necessary to treat or prevent imminent or life-threatening deterioration.  Critical care was time spent personally by me on the following activities: development of treatment plan with patient and/or surrogate as well as nursing, discussions with consultants, evaluation of patient's response to treatment, examination of patient, obtaining history from patient or surrogate, ordering and performing treatments and interventions, ordering and review of laboratory studies, ordering and review of radiographic studies, pulse oximetry and re-evaluation of patient's condition.  Otilio Carpen Gleason, PA-C [facility]  Pager# [phone], if no answer [phone]   Patient seen in concert with PA Gleason, discussed case examined at bedside together and discussed critical care planning.  Currently hypotensive and CXR with pulmonary edema. Will order echo for in the morning.  Will repeat CXR this pm to confirm tube position post repositioning.  Most likely has undiagnosed OSA, ? negative pressure pulmonary edema.  Will support pressure with levophed, hopefully can wean off in next few hours.

## 2019-03-10 NOTE — Anesthesia Postprocedure Evaluation (Signed)
Anesthesia Post Note  Patient: Geoffrey Fields  Procedure(s) Performed: Radial head arthroplasty with radial head excision left elbow and ligamentous repair reconstruction as necessary (Left )     Patient location during evaluation: PACU Anesthesia Type: General Level of consciousness: sedated Pain management: pain level controlled Vital Signs Assessment: post-procedure vital signs reviewed and stable Respiratory status: patient remains intubated per anesthesia plan Cardiovascular status: stable Postop Assessment: no apparent nausea or vomiting Anesthetic complications: yes Anesthetic complication details: required intubation and respiratory eventComments: Patient able to diuresis in PACU. Oxygenation improving. Able to wean FiO2. Likely negative pressure pulmonary edema.     Last Vitals:  Vitals:   03/10/19 2130 03/10/19 2139  BP:  (!) 92/53  Pulse: (!) 103 (!) 103  Resp: 20 (!) 22  Temp: 36.9 C   SpO2:  98%    Last Pain:  Vitals:   03/10/19 1432  TempSrc:   PainSc: 6                  Geoffrey Fields Geoffrey Fields Geoffrey Fields

## 2019-03-10 NOTE — Transfer of Care (Signed)
Immediate Anesthesia Transfer of Care Note  Patient: Geoffrey Fields  Procedure(s) Performed: Radial head arthroplasty with radial head excision left elbow and ligamentous repair reconstruction as necessary (Left )  Patient Location: PACU  Anesthesia Type:General  Level of Consciousness: sedated  Airway & Oxygen Therapy: Patient re-intubated and Patient placed on Ventilator (see vital sign flow sheet for setting)  Post-op Assessment: Report given to RN and Post -op Vital signs reviewed and stable  Post vital signs: Reviewed and stable  Last Vitals:  Vitals Value Taken Time  BP 101/52 03/10/19 1958  Temp    Pulse 113 03/10/19 2001  Resp 12 03/10/19 2001  SpO2 95 % 03/10/19 2001  Vitals shown include unvalidated device data.  Last Pain:  Vitals:   03/10/19 1432  TempSrc:   PainSc: 6       Patients Stated Pain Goal: 3 (42/68/34 1962)  Complications: No apparent anesthesia complications

## 2019-03-10 NOTE — Anesthesia Preprocedure Evaluation (Signed)
Anesthesia Evaluation  Patient identified by MRN, date of birth, ID band Patient awake    Reviewed: Allergy & Precautions, NPO status , Patient's Chart, lab work & pertinent test results  History of Anesthesia Complications Negative for: history of anesthetic complications  Airway Mallampati: II  TM Distance: >3 FB Neck ROM: Full    Dental  (+) Teeth Intact   Pulmonary Current Smoker and Patient abstained from smoking.,    Pulmonary exam normal        Cardiovascular negative cardio ROS Normal cardiovascular exam     Neuro/Psych negative neurological ROS  negative psych ROS   GI/Hepatic negative GI ROS, Neg liver ROS,   Endo/Other  negative endocrine ROS  Renal/GU negative Renal ROS  negative genitourinary   Musculoskeletal negative musculoskeletal ROS (+)   Abdominal   Peds  Hematology negative hematology ROS (+)   Anesthesia Other Findings   Reproductive/Obstetrics                           Anesthesia Physical Anesthesia Plan  ASA: II  Anesthesia Plan: General   Post-op Pain Management: GA combined w/ Regional for post-op pain   Induction: Intravenous  PONV Risk Score and Plan: 1 and Ondansetron, Dexamethasone, Treatment may vary due to age or medical condition and Midazolam  Airway Management Planned: Oral ETT and LMA  Additional Equipment: None  Intra-op Plan:   Post-operative Plan: Extubation in OR  Informed Consent: I have reviewed the patients History and Physical, chart, labs and discussed the procedure including the risks, benefits and alternatives for the proposed anesthesia with the patient or authorized representative who has indicated his/her understanding and acceptance.     Dental advisory given  Plan Discussed with:   Anesthesia Plan Comments:        Anesthesia Quick Evaluation

## 2019-03-10 NOTE — Anesthesia Procedure Notes (Signed)
Procedure Name: Intubation Date/Time: 03/10/2019 4:31 PM Performed by: Imagene Riches, CRNA Pre-anesthesia Checklist: Patient identified, Emergency Drugs available, Suction available and Patient being monitored Patient Re-evaluated:Patient Re-evaluated prior to induction Oxygen Delivery Method: Circle System Utilized Preoxygenation: Pre-oxygenation with 100% oxygen Induction Type: IV induction Ventilation: Oral airway inserted - appropriate to patient size and Mask ventilation without difficulty Laryngoscope Size: Miller and 2 Grade View: Grade II Tube type: Oral Tube size: 7.5 mm Number of attempts: 2 Airway Equipment and Method: Stylet and Oral airway Placement Confirmation: ETT inserted through vocal cords under direct vision,  positive ETCO2 and breath sounds checked- equal and bilateral Secured at: 22 cm Tube secured with: Tape Dental Injury: Teeth and Oropharynx as per pre-operative assessment  Comments: First DL with MAC 3, grade 2 view, esophageal ETT placement. ETT removed, patient mask ventilated until SpO2 improved. Second DL with miller 2, grade 1 view, ETT easily passed through cords.

## 2019-03-10 NOTE — Progress Notes (Signed)
Called back to OR for MD wanting to change the Ventilator setting. Changed to SIMV/PRVC/PS from [facility] Patient tolerating well. EtTube had to be withdrawn 2 cm per xray and radiology, from 23 to 21 cm at the lip. BBS heard post with better movement. Nebulizer given per order and now patient is awaiting bed on 4N19. Will continue to monitor.

## 2019-03-10 NOTE — Progress Notes (Signed)
Patient ID: Geoffrey Fields, male   DOB: 23-Oct-1975, 43 y.o.   MRN: [id number] Events noted Discussed with family Ladavia Lindenbaum MD

## 2019-03-11 ENCOUNTER — Observation Stay (HOSPITAL_COMMUNITY): Payer: Worker's Compensation

## 2019-03-11 ENCOUNTER — Inpatient Hospital Stay (HOSPITAL_COMMUNITY): Payer: Worker's Compensation

## 2019-03-11 DIAGNOSIS — Z8614 Personal history of Methicillin resistant Staphylococcus aureus infection: Secondary | ICD-10-CM | POA: Diagnosis not present

## 2019-03-11 DIAGNOSIS — J9601 Acute respiratory failure with hypoxia: Secondary | ICD-10-CM

## 2019-03-11 DIAGNOSIS — I959 Hypotension, unspecified: Secondary | ICD-10-CM | POA: Diagnosis not present

## 2019-03-11 DIAGNOSIS — J810 Acute pulmonary edema: Secondary | ICD-10-CM

## 2019-03-11 DIAGNOSIS — W19XXXA Unspecified fall, initial encounter: Secondary | ICD-10-CM | POA: Diagnosis present

## 2019-03-11 DIAGNOSIS — J988 Other specified respiratory disorders: Secondary | ICD-10-CM | POA: Diagnosis not present

## 2019-03-11 DIAGNOSIS — F17210 Nicotine dependence, cigarettes, uncomplicated: Secondary | ICD-10-CM | POA: Diagnosis present

## 2019-03-11 DIAGNOSIS — S53022A Posterior subluxation of left radial head, initial encounter: Secondary | ICD-10-CM | POA: Diagnosis present

## 2019-03-11 DIAGNOSIS — S52122A Displaced fracture of head of left radius, initial encounter for closed fracture: Secondary | ICD-10-CM | POA: Diagnosis present

## 2019-03-11 DIAGNOSIS — S42452A Displaced fracture of lateral condyle of left humerus, initial encounter for closed fracture: Secondary | ICD-10-CM | POA: Diagnosis present

## 2019-03-11 DIAGNOSIS — S5332XA Traumatic rupture of left ulnar collateral ligament, initial encounter: Secondary | ICD-10-CM | POA: Diagnosis present

## 2019-03-11 DIAGNOSIS — J9602 Acute respiratory failure with hypercapnia: Secondary | ICD-10-CM

## 2019-03-11 DIAGNOSIS — Y929 Unspecified place or not applicable: Secondary | ICD-10-CM | POA: Diagnosis not present

## 2019-03-11 DIAGNOSIS — Y9389 Activity, other specified: Secondary | ICD-10-CM | POA: Diagnosis not present

## 2019-03-11 DIAGNOSIS — J95821 Acute postprocedural respiratory failure: Secondary | ICD-10-CM | POA: Diagnosis not present

## 2019-03-11 DIAGNOSIS — Y990 Civilian activity done for income or pay: Secondary | ICD-10-CM | POA: Diagnosis not present

## 2019-03-11 DIAGNOSIS — Z20828 Contact with and (suspected) exposure to other viral communicable diseases: Secondary | ICD-10-CM | POA: Diagnosis present

## 2019-03-11 DIAGNOSIS — Z880 Allergy status to penicillin: Secondary | ICD-10-CM | POA: Diagnosis not present

## 2019-03-11 DIAGNOSIS — S42402A Unspecified fracture of lower end of left humerus, initial encounter for closed fracture: Secondary | ICD-10-CM | POA: Diagnosis present

## 2019-03-11 LAB — ECHOCARDIOGRAM COMPLETE
Height: 71 in
Weight: 4098.79 oz

## 2019-03-11 LAB — TRIGLYCERIDES: Triglycerides: 51 mg/dL (ref <150)

## 2019-03-11 LAB — MRSA PCR SCREENING: MRSA by PCR: NEGATIVE

## 2019-03-11 MED ORDER — CHLORHEXIDINE GLUCONATE 0.12% ORAL RINSE (MEDLINE KIT)
15.0000 mL | Freq: Two times a day (BID) | OROMUCOSAL | Status: DC
  Administered 2019-03-11 (×2): 15 mL via OROMUCOSAL

## 2019-03-11 MED ORDER — CHLORHEXIDINE GLUCONATE CLOTH 2 % EX PADS
6.0000 | MEDICATED_PAD | Freq: Every day | CUTANEOUS | Status: DC
  Administered 2019-03-11 – 2019-03-12 (×2): 6 via TOPICAL

## 2019-03-11 MED ORDER — LABETALOL HCL 5 MG/ML IV SOLN
INTRAVENOUS | Status: AC
  Filled 2019-03-11: qty 4

## 2019-03-11 MED ORDER — CHLORHEXIDINE GLUCONATE 0.12 % MT SOLN
15.0000 mL | Freq: Two times a day (BID) | OROMUCOSAL | Status: DC
  Administered 2019-03-11 – 2019-03-13 (×3): 15 mL via OROMUCOSAL
  Filled 2019-03-11 (×4): qty 15

## 2019-03-11 MED ORDER — ORAL CARE MOUTH RINSE
15.0000 mL | OROMUCOSAL | Status: DC
  Administered 2019-03-11 (×4): 15 mL via OROMUCOSAL

## 2019-03-11 MED ORDER — ORAL CARE MOUTH RINSE
15.0000 mL | Freq: Two times a day (BID) | OROMUCOSAL | Status: DC
  Administered 2019-03-11 – 2019-03-13 (×4): 15 mL via OROMUCOSAL

## 2019-03-11 NOTE — Procedures (Signed)
Extubation Procedure Note  Patient Details:   Name: Geoffrey Fields DOB: 24-Mar-1976 MRN: [id number]   Airway Documentation:    Vent end date: 03/11/19 Vent end time: 0900   Evaluation  O2 sats: stable throughout Complications: No apparent complications Patient did tolerate procedure well. Bilateral Breath Sounds: Clear  pateint able to speak Yes  Geoffrey Fields 03/11/2019, 9:01 AM

## 2019-03-11 NOTE — Progress Notes (Signed)
Patient ID: Geoffrey Fields, male   DOB: September 26, 1975, 43 y.o.   MRN: [id number] Patient seen and examined at bedside.  Patient is extubated.  I discussed all issues with critical care medicine.  At present juncture the patient looks quite well.  He is alert oriented and talkative.  He is intact to sensation and motor function about the left hand and has pain appropriate to the elbow reconstruction.  He is status post open reconstruction fracture dislocation to the elbow with radial head arthroplasty and ligamentous reconstruction.  I will call his family and update his progress.  He is in good spirits.  He will be in the intensive care unit the remainder of today and hopefully to the floor tomorrow.  Lower extremity examination is benign without signs of DVT.  He has sequential compression devices and understands the importance of ankle pumps.  I stressed the importance of finger motion and massage and other measures.  It is great to see him doing so well this morning.  Demontre Padin MD

## 2019-03-11 NOTE — Evaluation (Signed)
Occupational Therapy Evaluation Patient Details Name: Geoffrey BastaJeremy Stehlin MRN: [id number] DOB: 1975/05/27 Today's Date: 03/11/2019    History of Present Illness This 43 y.o male admitted for radial head arthroplasty with ligamentous repair and reconstruction due to Lt elbow fracture with subluxation and dislocation along with posterior capitellum avulsion injury with lateral ulnar collateral ligament tear.  Pt failed extubation post operatively and was admitted to ICU. Extubated 12/12.  PMH includes: MRSA, tobacco abuse   Clinical Impression   PTA patient independent and working. Admitted for above and limited by L UE pain and decreased functional use s/p surgery, decreased activity tolerance, impaired balance, and generalized weakness. Patient currently requires min assist for transfers, min-max assist for ADLs.  Educated on L UE precautions, sling wear schedule, elevation, use of ice and exercises.  On 2L supplemental O2 during session, trial of RA but pt dropping to mid 80s.  Patient will benefit from continued [facility] services while [facility] to optimize independence and safety with ADLs, IADLs and mobility.      Follow Up Recommendations  Home health OT;Supervision/Assistance - 24 hour    Equipment Recommendations  3 in 1 bedside commode    Recommendations for Other Services       Precautions / Restrictions Precautions Precautions: Fall Precaution Comments: watch 02 Required Braces or Orthoses: Sling Restrictions Weight Bearing Restrictions: Yes LUE Weight Bearing: Non weight bearing Other Position/Activity Restrictions: No WB restrictions in chart but assumed NWB      Mobility Bed Mobility Overal bed mobility: Needs Assistance Bed Mobility: Supine to Sit     Supine to sit: Mod assist;HOB elevated     General bed mobility comments: Assist with trunk to get to EOB. Increased time.  Transfers Overall transfer level: Needs assistance Equipment  used: 1 person hand held assist;None Transfers: Sit to/from Stand Sit to Stand: Min assist         General transfer comment: min assist to power up to standing, cueing for hand placement and safety    Balance Overall balance assessment: Needs assistance Sitting-balance support: Feet supported;No upper extremity supported Sitting balance-Leahy Scale: Good     Standing balance support: During functional activity;No upper extremity supported Standing balance-Leahy Scale: Fair                             ADL either performed or assessed with clinical judgement   ADL Overall ADL's : Needs assistance/impaired     Grooming: Minimal assistance;Sitting   Upper Body Bathing: Moderate assistance;Sitting   Lower Body Bathing: Moderate assistance;Sit to/from stand   Upper Body Dressing : Maximal assistance;Sitting   Lower Body Dressing: Maximal assistance;Sit to/from stand   Toilet Transfer: Minimal Engineer, waterassistance;BSC;Regular Toilet;Ambulation Toilet Transfer Details (indicate cue type and reason): 3:1 over commode  Toileting- Clothing Manipulation and Hygiene: Minimal assistance;Sit to/from stand       Functional mobility during ADLs: Minimal assistance;Min guard;Cueing for safety General ADL Comments: pt limited by L UE pain, impaired balance and decreased activity tolerance     Vision   Vision Assessment?: No apparent visual deficits     Perception     Praxis      Pertinent Vitals/Pain Pain Assessment: 0-10 Pain Score: 10-Worst pain ever Faces Pain Scale: Hurts little more Pain Location: LUE Pain Descriptors / Indicators: Sore;Operative site guarding Pain Intervention(s): Monitored during session;Repositioned;Limited activity within patient's tolerance     Hand Dominance Right  Extremity/Trunk Assessment Upper Extremity Assessment Upper Extremity Assessment: LUE deficits/detail LUE Deficits / Details: s/p elbow surgery, able to wiggle fingers but  limited due to pain; reports hypersensitive fingers   LUE: Unable to fully assess due to immobilization;Unable to fully assess due to pain LUE Sensation: (hypersensitive distally) LUE Coordination: decreased fine motor;decreased gross motor   Lower Extremity Assessment Lower Extremity Assessment: Defer to PT evaluation   Cervical / Trunk Assessment Cervical / Trunk Assessment: Normal   Communication Communication Communication: No difficulties   Cognition Arousal/Alertness: Awake/alert Behavior During Therapy: WFL for tasks assessed/performed Overall Cognitive Status: Within Functional Limits for tasks assessed                                 General Comments: A&Ox4, needs some repetition of cues to adhere to restrictions for LUE.   General Comments  Pt on 2L supplemental O2 via [place] during session, on RA droped to mid 80s but recovered well with 2L; Hr up to 127 bpm    Exercises     Shoulder Instructions      Home Living Family/patient expects to be discharged to:: Private residence Living Arrangements: Other relatives(cousin) Available Help at Discharge: Family;Available PRN/intermittently Type of Home: House Home Access: Stairs to enter [employer] of Steps: 4-5 Entrance Stairs-Rails: Right;Left Home Layout: One level     Bathroom Shower/Tub: Chief Strategy Officer: Standard     Home Equipment: None          Prior Functioning/Environment Level of Independence: Independent        Comments: Working with Music therapist. Drives.  Cooks/cleans. been sleeping sitting up on couch        OT Problem List: Decreased strength;Decreased range of motion;Decreased activity tolerance;Impaired balance (sitting and/or standing);Decreased coordination;Decreased safety awareness;Decreased knowledge of use of DME or AE;Decreased knowledge of precautions;Cardiopulmonary status limiting activity;Pain;Impaired UE functional  use;Obesity;Impaired sensation      OT Treatment/Interventions: Self-care/ADL training;DME and/or AE instruction;Therapeutic exercise;Therapeutic activities;Patient/family education;Balance training    OT Goals(Current goals can be found in the care plan section) Acute Rehab OT Goals Patient Stated Goal: to get this pain better OT Goal Formulation: With patient Time For Goal Achievement: 03/25/19 Potential to Achieve Goals: Good  OT Frequency: Min 2X/week   Barriers to D/C:            Co-evaluation PT/OT/SLP Co-Evaluation/Treatment: Yes Reason for Co-Treatment: For patient/therapist safety(extubated ~1 hr prior to session) PT goals addressed during session: Mobility/safety with mobility OT goals addressed during session: ADL's and self-care      AM-PAC OT "6 Clicks" Daily Activity     Outcome Measure Help from another person eating meals?: A Little Help from another person taking care of personal grooming?: A Little Help from another person toileting, which includes using toliet, bedpan, or urinal?: A Lot Help from another person bathing (including washing, rinsing, drying)?: A Lot Help from another person to put on and taking off regular upper body clothing?: A Lot Help from another person to put on and taking off regular lower body clothing?: A Lot 6 Click Score: 14   End of Session Equipment Utilized During Treatment: Other (comment);Oxygen(sling, 2L) Nurse Communication: Mobility status  Activity Tolerance: Patient tolerated treatment well Patient left: in chair;with call bell/phone within reach;with chair alarm set;with nursing/sitter in room  OT Visit Diagnosis: Other abnormalities of gait and mobility (R26.89);Pain Pain - Right/Left: Left Pain - part of  body: Arm                Time: 7290-2111 OT Time Calculation (min): 31 min Charges:  OT General Charges $OT Visit: 1 Visit OT Evaluation $OT Eval Moderate Complexity: 1 Mod  Jolaine Artist, OT Acute  Rehabilitation Services Pager [phone] Office [phone]   Delight Stare 03/11/2019, 1:17 PM

## 2019-03-11 NOTE — Progress Notes (Signed)
Assisted tele visit to patient with family member.  Tianna Baus M, RN  

## 2019-03-11 NOTE — Progress Notes (Signed)
NAME:  Geoffrey Fields, MRN:  [id number], DOB:  Feb 15, 1976, LOS: 1 ADMISSION DATE:  03/10/2019, CONSULTATION DATE: 03/10/2019 REFERRING MD: Dr. Amanda Pea, CHIEF COMPLAINT: Respiratory failure  Brief History   43 y.o. M who had on L elbow fracture at work on 03/08/19, he underwent surgical repair today and failed extubation, therefore PCCM consulted for ICU transfer.    History of present illness   Geoffrey Fields is a 43 y.o. M who has PMH of MRSA and tobacco use who sustained a comminuted L capitellar and radial head and neck fx fracture and subluxation with hemarthrosis on 12/9 requiring surgical repair which he underwent today.  Post-op, he failed extubation and PCCM consulted for ICU admission and ventilator management  Past Medical History  Tobacco use, MRSA   Significant Hospital Events   12/11 Admit to Orthopedic surgery, post-op PCCM consult  Consults:  PCCM  Procedures:  12/11 ETT 12/11 Surgical reconstruction of the L elbow  Significant Diagnostic Tests:  12/11  CXR>>Diffuse bilateral hazy airspace opacities. These are of unknown clinical significance and may be secondary to developing pulmonary edema or aspiration  Micro Data:  12/10 Sars-CoV-2>>negative  Antimicrobials:  Ancef 12/11  Interim history/subjective:  Failed extubation postop transferred to ICU for vent support Is awake and alert this morning Weaning well  Objective   Blood pressure 132/88, pulse (!) 111, temperature 98.9 F (37.2 C), temperature source Axillary, resp. rate (!) 23, height 5\' 11"  (1.803 m), weight 116.2 kg, SpO2 100 %.    Vent Mode: CPAP FiO2 (%):  [40 %-100 %] 40 % Set Rate:  [16 bmp] 16 bmp Vt Set:  [600 mL] 600 mL PEEP:  [5 cmH20-8 cmH20] 5 cmH20 Pressure Support:  [5 cmH20-10 cmH20] 5 cmH20 Plateau Pressure:  [18 cmH20-22 cmH20] 18 cmH20   Intake/Output Summary (Last 24 hours) at 03/11/2019 0848 Last data filed at 03/11/2019 0800 Gross per 24 hour  Intake 3005.91 ml  Output  1900 ml  Net 1105.91 ml   Filed Weights   03/09/19 1904 03/10/19 1424 03/11/19 0500  Weight: 108.9 kg 109.9 kg 116.2 kg    Examination: General: Middle-aged gentleman, does not appear to be in respiratory distress HENT: Moist oral mucosa, endotracheal tube in place Lungs: Decreased air entry at the bases, clear breath sounds anteriorly Cardiovascular: S1-S2 appreciated, no murmur Abdomen: Soft, bowel sounds appreciated Extremities: No clubbing, no lower extremity edema Neuro: Awake and alert, interactive, moving all extremities GU:   Resolved Hospital Problem list     Assessment & Plan:  Acute hypoxic and hypercapnic respiratory failure Postoperative respiratory failure -Hypoventilated lung fields on chest x-ray -Weaning as tolerated -Bronchial hygiene -Off sedation and awake and alert  Comminuted left radial head and shaft fracture s/p surgical repair -Further management per orthopedic surgery -Pain management as needed  Patient is weaning well -We will extubate -Continue to monitor closely -Likelihood of failure considered   Best practice:  Diet: N.p.o., will advance as tolerated Pain/Anxiety/Delirium protocol (if indicated): Precedex/fentanyl-on hold VAP protocol (if indicated): In place DVT prophylaxis: Lovenox GI prophylaxis: Pepcid Glucose control: Not applicable Mobility: Bedrest Code Status: Full code Family Communication: Per primary Disposition: ICU  Labs   CBC: Recent Labs  Lab 03/10/19 1420 03/10/19 1927 03/10/19 2054  WBC 11.1*  --  15.5*  HGB 14.2 13.3 12.6*  HCT 40.9 39.0 38.0*  MCV 88.7  --  91.1  PLT 253  --  234    Basic Metabolic Panel: Recent Labs  Lab 03/10/19 1927 03/10/19  2054  NA 134*  --   K 4.3  --   CREATININE  --  1.03   GFR: Estimated Creatinine Clearance: 122.4 mL/min (by C-G formula based on SCr of 1.03 mg/dL). Recent Labs  Lab 03/10/19 1420 03/10/19 2054  WBC 11.1* 15.5*    Liver Function Tests: No  results for input(s): AST, ALT, ALKPHOS, BILITOT, PROT, ALBUMIN in the last 168 hours. No results for input(s): LIPASE, AMYLASE in the last 168 hours. No results for input(s): AMMONIA in the last 168 hours.  ABG    Component Value Date/Time   PHART 7.218 (L) 03/10/2019 1927   PCO2ART 66.1 (HH) 03/10/2019 1927   PO2ART 51.0 (L) 03/10/2019 1927   HCO3 26.9 03/10/2019 1927   TCO2 29 03/10/2019 1927   ACIDBASEDEF 2.0 03/10/2019 1927   O2SAT 76.0 03/10/2019 1927     Coagulation Profile: No results for input(s): INR, PROTIME in the last 168 hours.  Cardiac Enzymes: No results for input(s): CKTOTAL, CKMB, CKMBINDEX, TROPONINI in the last 168 hours.  HbA1C: No results found for: HGBA1C  CBG: No results for input(s): GLUCAP in the last 168 hours.  Review of Systems:   Patient is awake and alert Denies pain or discomfort Denies underlying lung disease  Past Medical History  He,  has a past medical history of Medical history non-contributory.   Surgical History    Past Surgical History:  Procedure Laterality Date  . NO PAST SURGERIES       Social History   reports that he has been smoking. He has never used smokeless tobacco. He reports previous alcohol use. He reports previous drug use.   Family History   His family history is not on file.   Allergies Allergies  Allergen Reactions  . Penicillins Rash    Did it involve swelling of the face/tongue/throat, SOB, or low BP? No Did it involve sudden or severe rash/hives, skin peeling, or any reaction on the inside of your mouth or nose? No Did you need to seek medical attention at a hospital or doctor's office? No When did it last happen?Childhood If all above answers are "NO", may proceed with cephalosporin use.     Home Medications  Prior to Admission medications   Medication Sig Start Date End Date Taking? Authorizing Provider  methocarbamol (ROBAXIN) 500 MG tablet Take 500 mg by mouth every 6 (six) hours as  needed for muscle spasms.   Yes [provider]  oxycodone (OXY-IR) 5 MG capsule Take 5 mg by mouth every 4 (four) hours as needed for pain.   Yes [provider]     The patient is critically ill with multiple organ systems failure and requires high complexity decision making for assessment and support, frequent evaluation and titration of therapies, application of advanced monitoring technologies and extensive interpretation of multiple databases. Critical Care Time devoted to patient care services described in this note independent of APP/resident time (if applicable)  is 30 minutes.   Sherrilyn Rist MD [facility] Personal pager: [phone] If unanswered, please page CCM On-call: [phone]

## 2019-03-11 NOTE — Progress Notes (Signed)
*  PRELIMINARY RESULTS* Echocardiogram 2D Echocardiogram has been performed.  Geoffrey Fields 03/11/2019, 2:53 PM

## 2019-03-11 NOTE — Evaluation (Addendum)
Physical Therapy Evaluation Patient Details Name: Geoffrey Fields MRN: [id number] DOB: January 01, 1976 Today's Date: 03/11/2019   History of Present Illness  This 43 y.o male admitted for radial head arthroplasty with ligamentous repair and reconstruction due to Lt elbow fracture with subluxation and dislocation along with posterior capitellum avulsion injury with lateral ulnar collateral ligament tear.  Pt failed extubation post operatively and was admitted to ICU. Extubated 12/12.  PMH includes: MRSA, tobacco abuse  Clinical Impression  Patient presents with LUE pain, generalized weakness and impaired mobility s/p above. Pt reports being independent PTA and drives bulldozers for work. Today, pt requires Mod A for bed mobility, Min A for transfers and min guard for ambulation. Sp02 dropped on RA to mid 80s so donned 2L/min 02. Anticipate pt will progress quickly with mobility. Educated on exercises, positioning and precautions of LUE. Will follow acutely to maximize independence and mobility prior to return home.    Follow Up Recommendations Home health PT;Supervision for mobility/OOB    Equipment Recommendations  None recommended by PT    Recommendations for Other Services       Precautions / Restrictions Precautions Precautions: (P) Fall Precaution Comments: (P) watch 02 Required Braces or Orthoses: (P) Sling Restrictions Weight Bearing Restrictions: (P) Yes LUE Weight Bearing: (P) Non weight bearing Other Position/Activity Restrictions: (P) No WB restrictions in chart but assumed NWB      Mobility  Bed Mobility Overal bed mobility: Needs Assistance Bed Mobility: Supine to Sit     Supine to sit: Mod assist;HOB elevated     General bed mobility comments: Assist with trunk to get to EOB. Increased time.  Transfers Overall transfer level: Needs assistance Equipment used: 1 person hand held assist;None Transfers: Sit to/from Stand Sit to Stand: Min assist         General  transfer comment: Min A to power to standing from EOB x1, from [facility] x1, transferred to chair post ambulation.  Ambulation/Gait Ambulation/Gait assistance: Min guard Gait Distance (Feet): 120 Feet Assistive device: None Gait Pattern/deviations: Step-through pattern;Decreased stride length   Gait velocity interpretation: 1.31 - 2.62 ft/sec, indicative of limited community ambulator General Gait Details: Slow, mostly steady gait. Sp02 dropped to 75% on 2L/min 02 for a quick period and then recovered quickly after rest break and cues for pursed lip breathing.  Stairs            Wheelchair Mobility    Modified Rankin (Stroke Patients Only)       Balance Overall balance assessment: Needs assistance Sitting-balance support: Feet supported;No upper extremity supported Sitting balance-Leahy Scale: Good     Standing balance support: During functional activity Standing balance-Leahy Scale: Fair                               Pertinent Vitals/Pain Pain Assessment: (P) 0-10 Pain Score: (P) 10-Worst pain ever Faces Pain Scale: (P) Hurts little more Pain Location: (P) LUE Pain Descriptors / Indicators: (P) Sore;Operative site guarding Pain Intervention(s): (P) Monitored during session;Repositioned;Limited activity within patient's tolerance    Home Living Family/patient expects to be discharged to:: (P) Private residence Living Arrangements: (P) Other relatives(cousin) Available Help at Discharge: (P) Family;Available PRN/intermittently Type of Home: (P) House Home Access: (P) Stairs to enter Entrance Stairs-Rails: (P) Right;Left Entrance Stairs-Number of Steps: (P) 4-5 Home Layout: (P) One level Home Equipment: (P) None      Prior Function Level of Independence: (P) Independent  Comments: (P) Working with [employer]. Drives.  Cooks/cleans. been sleeping sitting up on couch     Hand Dominance   Dominant Hand: (P) Right     Extremity/Trunk Assessment   Upper Extremity Assessment Upper Extremity Assessment: (P) LUE deficits/detail    Lower Extremity Assessment Lower Extremity Assessment: Overall WFL for tasks assessed    Cervical / Trunk Assessment Cervical / Trunk Assessment: Normal  Communication   Communication: (P) No difficulties  Cognition Arousal/Alertness: (P) Awake/alert Behavior During Therapy: (P) WFL for tasks assessed/performed Overall Cognitive Status: (P) Within Functional Limits for tasks assessed                                 General Comments: (P) A&Ox4, needs some repetition of cues to adhere to restrictions for LUE.      General Comments General comments (skin integrity, edema, etc.): Sp02 dropped to mid 80s on RA during session so donned 2L/min 02. HR up to 127 bpm.    Exercises     Assessment/Plan    PT Assessment Patient needs continued PT services  PT Problem List Decreased mobility;Decreased safety awareness;Pain;Impaired sensation;Cardiopulmonary status limiting activity;Decreased skin integrity;Decreased cognition;Decreased knowledge of precautions;Decreased range of motion       PT Treatment Interventions Therapeutic activities;Gait training;Therapeutic exercise;Patient/family education;Balance training;Functional mobility training;Stair training    PT Goals (Current goals can be found in the Care Plan section)  Acute Rehab PT Goals Patient Stated Goal: to get this pain better PT Goal Formulation: With patient Time For Goal Achievement: 03/25/19 Potential to Achieve Goals: Fair    Frequency Min 3X/week   Barriers to discharge Decreased caregiver support      Co-evaluation PT/OT/SLP Co-Evaluation/Treatment: Yes Reason for Co-Treatment: (P) For patient/therapist safety(extubated ~1 hr prior to session) PT goals addressed during session: Mobility/safety with mobility OT goals addressed during session: (P) ADL's and self-care       AM-PAC  PT "6 Clicks" Mobility  Outcome Measure Help needed turning from your back to your side while in a flat bed without using bedrails?: A Little Help needed moving from lying on your back to sitting on the side of a flat bed without using bedrails?: A Lot Help needed moving to and from a bed to a chair (including a wheelchair)?: A Little Help needed standing up from a chair using your arms (e.g., wheelchair or bedside chair)?: A Little Help needed to walk in hospital room?: A Little Help needed climbing 3-5 steps with a railing? : A Little 6 Click Score: 17    End of Session Equipment Utilized During Treatment: Oxygen Activity Tolerance: Patient tolerated treatment well;Treatment limited secondary to medical complications (Comment)(drop in SP02, tachycardia) Patient left: in chair;with call bell/phone within reach;with chair alarm set Nurse Communication: Mobility status PT Visit Diagnosis: Pain Pain - Right/Left: Left Pain - part of body: Arm    Time: 1015-1045 PT Time Calculation (min) (ACUTE ONLY): 30 min   Charges:   PT Evaluation $PT Eval Moderate Complexity: 1 Mod          Marisa Severin, PT, DPT Acute Rehabilitation Services Pager [phone] Office [phone]      Geoffrey Fields 03/11/2019, 1:11 PM

## 2019-03-12 MED ORDER — BISACODYL 10 MG RE SUPP: 10.0000 mg | Freq: Once | RECTAL | Status: DC

## 2019-03-12 MED ORDER — FAMOTIDINE 20 MG PO TABS
20.0000 mg | ORAL_TABLET | Freq: Two times a day (BID) | ORAL | Status: DC
  Administered 2019-03-12 – 2019-03-13 (×2): 20 mg via ORAL
  Filled 2019-03-12 (×3): qty 1

## 2019-03-12 MED ORDER — POLYETHYLENE GLYCOL 3350 17 G PO PACK
17.0000 g | PACK | Freq: Once | ORAL | Status: AC
  Administered 2019-03-12: 17 g via ORAL
  Filled 2019-03-12: qty 1

## 2019-03-12 MED ORDER — SENNA 8.6 MG PO TABS
1.0000 | ORAL_TABLET | Freq: Every day | ORAL | Status: AC
  Administered 2019-03-12 – 2019-03-13 (×2): 8.6 mg via ORAL
  Filled 2019-03-12 (×3): qty 1

## 2019-03-12 NOTE — Progress Notes (Signed)
Patient ID: Geoffrey Fields, male   DOB: [birth year], 43 y.o.   MRN: [id number] Patient is seen and evaluated at bedside.  Patient is doing very well in regards to his left elbow.  He is intact to sensation and motor function about the hand and has no signs of compartment syndrome dystrophic reaction or infection.  I reviewed this at length and the findings.  Lower extremity examination shows normal motion motor function and no signs of DVT.  The patient and I discussed all issues.  Present time the left upper extremity is quite stable regarding the surgical reconstruction.  His admission due to respiratory decompression is improving nicely.  Hopefully he can go to the floor today and possibly discharge tomorrow.     I discussed with the patient that the next steps depend on his respiratory status and he understands this.  He has been counseled in regards to possible sleep apnea and work-up regarding this.  All questions have been encouraged and answered.  Pleasure to see him today.  Roseanne Kaufman cell phone [phone]

## 2019-03-12 NOTE — Progress Notes (Signed)
Patient now extubated, doing well in ICU. Post-operative anesthetic events from Friday night discussed with patient.  All questioned answered. Recommend outpatient evaluation for OSA.

## 2019-03-12 NOTE — Addendum Note (Signed)
Addendum  created 03/12/19 1312 by Murvin Natal, MD   Clinical Note Signed

## 2019-03-12 NOTE — Progress Notes (Signed)
NAME:  Geoffrey Fields, MRN:  [id number], DOB:  1975-04-19, LOS: 2 ADMISSION DATE:  03/10/2019, CONSULTATION DATE: 03/10/2019 REFERRING MD: Dr. Amedeo Plenty, CHIEF COMPLAINT: Respiratory failure  Brief History   43 y.o. M who had on L elbow fracture at work on 03/08/19, he underwent surgical repair today and failed extubation, therefore PCCM consulted for ICU transfer.   History of present illness   Geoffrey Fields is a 43 y.o. M who has PMH of MRSA and tobacco use who sustained a comminuted L capitellar and radial head and neck fx fracture and subluxation with hemarthrosis on 12/9 requiring surgical repair which he underwent today.  Post-op, he failed extubation and PCCM consulted for ICU admission and ventilator management  Past Medical History  Tobacco use, MRSA   Significant Hospital Events   12/11 Admit to Orthopedic surgery, post-op PCCM consult  Consults:  PCCM  Procedures:  12/11 ETT 12/11 Surgical reconstruction of the L elbow  Significant Diagnostic Tests:  12/11  CXR>>Diffuse bilateral hazy airspace opacities. These are of unknown clinical significance and may be secondary to developing pulmonary edema or aspiration  Micro Data:  12/10 Sars-CoV-2>>negative  Antimicrobials:  Ancef 12/11  Interim history/subjective:  Successfully extubated 03/11/2019 Noted to be desaturating overnight, was placed on oxygen Likely has some component of obstructive sleep apnea  Objective   Blood pressure 119/64, pulse (!) 105, temperature 98.4 F (36.9 C), temperature source Oral, resp. rate (!) 21, height 5\' 11"  (1.803 m), weight 117.5 kg, SpO2 96 %.        Intake/Output Summary (Last 24 hours) at 03/12/2019 1035 Last data filed at 03/12/2019 0900 Gross per 24 hour  Intake 1778.02 ml  Output 575 ml  Net 1203.02 ml   Filed Weights   03/10/19 1424 03/11/19 0500 03/12/19 0500  Weight: 109.9 kg 116.2 kg 117.5 kg    Examination: General: Middle-aged gentleman, does not look to be in  distress HENT: Moist oral mucosa Lungs: Decreased air entry at the bases, clear breath sounds Cardiovascular: S1-S2 appreciated Abdomen: Soft, bowel sounds appreciated Extremities: No clubbing, no edema Neuro: Awake and alert, interactive, moving all extremities GU:   Resolved Hospital Problem list     Assessment & Plan:  Acute hypoxic and hypercapnic respiratory failure Postoperative respiratory failure -Successfully extubated 03/11/2019 -Requiring supplemental oxygen occasionally  On discussions -Does have a history of snoring -No witnessed apnea -Body habitus supports a diagnosis of obstructive sleep apnea -Nocturnal desaturations also will be consistent with having obstructive sleep apnea -He will require a home sleep study -Can be scheduled to follow-up for further evaluation for OSA  Comminuted left radial head and shaft fracture s/p surgical repair -Further management per Ortho surgery -Pain management as needed -He is safe to be transferred to the floor today    Best practice:  Diet: Tolerating diet this morning Pain/Anxiety/Delirium protocol (if indicated): As needed pain medication VAP protocol (if indicated): Not applicable DVT prophylaxis: Lovenox GI prophylaxis: Pepcid Glucose control: Not applicable Mobility: Bedrest Code Status: Full code Family Communication: Per primary Disposition: Transfer to [place]: Recent Labs  Lab 03/10/19 1420 03/10/19 1927 03/10/19 2054  WBC 11.1*  --  15.5*  HGB 14.2 13.3 12.6*  HCT 40.9 39.0 38.0*  MCV 88.7  --  91.1  PLT 253  --  423    Basic Metabolic Panel: Recent Labs  Lab 03/10/19 1927 03/10/19 2054  NA 134*  --   K 4.3  --   CREATININE  --  1.03   GFR: Estimated Creatinine Clearance: 123.1 mL/min (by C-G formula based on SCr of 1.03 mg/dL). Recent Labs  Lab 03/10/19 1420 03/10/19 2054  WBC 11.1* 15.5*    Liver Function Tests: No results for input(s): AST, ALT, ALKPHOS, BILITOT,  PROT, ALBUMIN in the last 168 hours. No results for input(s): LIPASE, AMYLASE in the last 168 hours. No results for input(s): AMMONIA in the last 168 hours.  ABG    Component Value Date/Time   PHART 7.218 (L) 03/10/2019 1927   PCO2ART 66.1 (HH) 03/10/2019 1927   PO2ART 51.0 (L) 03/10/2019 1927   HCO3 26.9 03/10/2019 1927   TCO2 29 03/10/2019 1927   ACIDBASEDEF 2.0 03/10/2019 1927   O2SAT 76.0 03/10/2019 1927     Review of Systems:   Patient is awake and alert Denies pain or discomfort Denies underlying lung disease -Admits to a significant history of snoring  Past Medical History  He,  has a past medical history of Medical history non-contributory.   Surgical History    Past Surgical History:  Procedure Laterality Date  . NO PAST SURGERIES       Social History   reports that he has been smoking. He has never used smokeless tobacco. He reports previous alcohol use. He reports previous drug use.   Family History   His family history is not on file.   Allergies Allergies  Allergen Reactions  . Penicillins Rash    Did it involve swelling of the face/tongue/throat, SOB, or low BP? No Did it involve sudden or severe rash/hives, skin peeling, or any reaction on the inside of your mouth or nose? No Did you need to seek medical attention at a hospital or doctor's office? No When did it last happen?Childhood If all above answers are "NO", may proceed with cephalosporin use.    Virl Diamond, MD [place], PCCM Cell: [phone]

## 2019-03-13 LAB — TRIGLYCERIDES: Triglycerides: 62 mg/dL (ref <150)

## 2019-03-13 NOTE — Discharge Instructions (Signed)
Please remember to ice and continue to move and massage her fingers.  It is absolutely okay for you to walk around and be more mobile and active.  For showers please keep your bandage clean and dry.  Please notify should any problems occur.  We will plan to see you back in the office Monday, December 28 at 10:15 AM for your follow-up.  We encourage you to rest, eat good meals and also recommend referral to your regular physician to discuss a evaluation for sleep apnea.  If there are any problems please call our office at [phone] [facility] Dr. Amedeo Plenty

## 2019-03-13 NOTE — Discharge Summary (Signed)
Physician Discharge Summary  Patient ID: Geoffrey Fields MRN: [id number] DOB/AGE: 1975/09/09 43 y.o.  Admit date: 03/10/2019 Discharge date:   Admission Diagnoses: Left elbow fracture Past Medical History:  Diagnosis Date  . Medical history non-contributory     Discharge Diagnoses:  Active Problems:   Respiratory decompensation   Respiratory failure (HCC)   Left elbow fracture   Surgeries: Procedure(s): Radial head arthroplasty with radial head excision left elbow and ligamentous repair reconstruction as necessary on 03/10/2019    Consultants:   Discharged Condition: Improved  Hospital Course: Geoffrey Fields is an 43 y.o. male who was admitted 03/10/2019 with a chief complaint of No chief complaint on file. , and found to have a diagnosis of Left elbow fracture.  They were brought to the operating room on 03/10/2019 and underwent Procedure(s): Radial head arthroplasty with radial head excision left elbow and ligamentous repair reconstruction as necessary.    They were given perioperative antibiotics:  Anti-infectives (From admission, onward)   Start     Dose/Rate Route Frequency Ordered Stop   03/11/19 0600  ceFAZolin (ANCEF) IVPB 2g/100 mL premix     2 g 200 mL/hr over 30 Minutes Intravenous On call to O.R. 03/10/19 1421 03/10/19 1704   03/11/19 0400  ceFAZolin (ANCEF) IVPB 1 g/50 mL premix  Status:  Discontinued     1 g 100 mL/hr over 30 Minutes Intravenous Every 8 hours 03/10/19 2210 03/12/19 1304   03/10/19 2215  ceFAZolin (ANCEF) IVPB 1 g/50 mL premix     1 g 100 mL/hr over 30 Minutes Intravenous NOW 03/10/19 2210 03/11/19 0034    .  They were given sequential compression devices, early ambulation, and intermittent Lovenox  Recent vital signs:  Patient Vitals for the past 24 hrs:  BP Temp Temp src Pulse Resp SpO2  03/12/19 2255 119/71 97.8 F (36.6 C) Oral 97 14 93 %  03/12/19 1611 125/82 98.7 F (37.1 C) Oral 95 16 100 %  03/12/19 1500 -- -- -- 99 19 91 %   03/12/19 1400 -- -- -- (!) 114 (!) 21 94 %  03/12/19 1200 118/67 98.4 F (36.9 C) Oral 96 (!) 22 97 %  03/12/19 1100 119/76 -- -- (!) 105 (!) 25 97 %  03/12/19 0900 119/64 -- -- (!) 105 (!) 21 96 %  .  Recent laboratory studies: ECHOCARDIOGRAM COMPLETE  Result Date: 03/11/2019   ECHOCARDIOGRAM REPORT   Patient Name:   Geoffrey Fields Date of Exam: 03/11/2019 Medical Rec #:  [id number]      Height:       71.0 in Accession #:    [id number]     Weight:       256.2 lb Date of Birth:  December 12, 1975      BSA:          2.34 m Patient Age:    43 years       BP:           124/74 mmHg Patient Gender: M              HR:           122 bpm. Exam Location:  Inpatient Procedure: 2D Echo Indications:    Pulmonary edema [[id number]]  History:        Patient has no prior history of Echocardiogram examinations.                 Risk Factors:Current Smoker. Respiratory decompensation,  Respiratory Failure, Left Elbow Fracture.  Sonographer:    Jeryl Columbia Referring Phys: [id number] LAURA R GLEASON IMPRESSIONS  1. Left ventricular ejection fraction, by visual estimation, is 60 to 65%. The left ventricle has normal function. There is mildly increased left ventricular hypertrophy.  2. The left ventricle has no regional wall motion abnormalities.  3. Global right ventricle has normal systolic function.The right ventricular size is normal. No increase in right ventricular wall thickness.  4. Left atrial size was normal.  5. Right atrial size was normal.  6. The mitral valve is normal in structure. No evidence of mitral valve regurgitation. No evidence of mitral stenosis.  7. The tricuspid valve is normal in structure. Tricuspid valve regurgitation is not demonstrated.  8. The aortic valve is normal in structure. Aortic valve regurgitation is not visualized. No evidence of aortic valve sclerosis or stenosis.  9. The pulmonic valve was normal in structure. Pulmonic valve regurgitation is not visualized. 10. The inferior vena  cava is dilated in size with >50% respiratory variability, suggesting right atrial pressure of 8 mmHg. FINDINGS  Left Ventricle: Left ventricular ejection fraction, by visual estimation, is 60 to 65%. The left ventricle has normal function. The left ventricle has no regional wall motion abnormalities. There is mildly increased left ventricular hypertrophy. Left ventricular diastolic parameters were normal. Normal left atrial pressure. Right Ventricle: The right ventricular size is normal. No increase in right ventricular wall thickness. Global RV systolic function is has normal systolic function. Left Atrium: Left atrial size was normal in size. Right Atrium: Right atrial size was normal in size Pericardium: There is no evidence of pericardial effusion. Mitral Valve: The mitral valve is normal in structure. No evidence of mitral valve regurgitation. No evidence of mitral valve stenosis by observation. Tricuspid Valve: The tricuspid valve is normal in structure. Tricuspid valve regurgitation is not demonstrated. Aortic Valve: The aortic valve is normal in structure. Aortic valve regurgitation is not visualized. The aortic valve is structurally normal, with no evidence of sclerosis or stenosis. Pulmonic Valve: The pulmonic valve was normal in structure. Pulmonic valve regurgitation is not visualized. Pulmonic regurgitation is not visualized. Aorta: The aortic root, ascending aorta and aortic arch are all structurally normal, with no evidence of dilitation or obstruction. Venous: The inferior vena cava is dilated in size with greater than 50% respiratory variability, suggesting right atrial pressure of 8 mmHg. IAS/Shunts: No atrial level shunt detected by color flow Doppler. There is no evidence of a patent foramen ovale. No ventricular septal defect is seen or detected. There is no evidence of an atrial septal defect.  LEFT VENTRICLE PLAX 2D LVIDd:         4.00 cm  Diastology LVIDs:         3.20 cm  LV e' lateral:    18.50 cm/s LV PW:         1.30 cm  LV E/e' lateral: 4.7 LV IVS:        1.20 cm  LV e' medial:    16.50 cm/s LVOT diam:     2.40 cm  LV E/e' medial:  5.2 LV SV:         29 ml LV SV Index:   11.83 LVOT Area:     4.52 cm  RIGHT VENTRICLE RV S prime:     19.80 cm/s TAPSE (M-mode): 3.8 cm LEFT ATRIUM             Index       RIGHT ATRIUM  Index LA diam:        2.90 cm 1.24 cm/m  RA Area:     12.60 cm LA Vol (A2C):   49.6 ml 21.17 ml/m RA Volume:   30.90 ml  13.19 ml/m LA Vol (A4C):   53.8 ml 22.97 ml/m LA Biplane Vol: 54.5 ml 23.27 ml/m   AORTA Ao Root diam: 3.40 cm MITRAL VALVE MV Area (PHT): 4.63 cm             SHUNTS MV PHT:        47.56 msec           Systemic Diam: 2.40 cm MV Decel Time: 164 msec MV E velocity: 86.50 cm/s 103 cm/s MV A velocity: 82.70 cm/s 70.3 cm/s MV E/A ratio:  1.05       1.5  Zoila Shutter MD Electronically signed by Zoila Shutter MD Signature Date/Time: 03/11/2019/3:22:30 PM    Final     Discharge Medications:   Allergies as of 03/13/2019      Reactions   Penicillins Rash   Did it involve swelling of the face/tongue/throat, SOB, or low BP? No Did it involve sudden or severe rash/hives, skin peeling, or any reaction on the inside of your mouth or nose? No Did you need to seek medical attention at a hospital or doctor's office? No When did it last happen?Childhood If all above answers are "NO", may proceed with cephalosporin use.      Medication List    TAKE these medications   methocarbamol 500 MG tablet Commonly known as: ROBAXIN Take 500 mg by mouth every 6 (six) hours as needed for muscle spasms.   oxycodone 5 MG capsule Commonly known as: OXY-IR Take 5 mg by mouth every 4 (four) hours as needed for pain.       Diagnostic Studies: CT ELBOW LEFT WO CONTRAST  Result Date: 03/09/2019 CLINICAL DATA:  Left elbow fracture EXAM: CT OF THE UPPER LEFT EXTREMITY WITHOUT CONTRAST TECHNIQUE: Multidetector CT imaging of the upper left extremity was  performed according to the standard protocol. 3D reformatted images were created on an independent workstation. COMPARISON:  None available. FINDINGS: Bones/Joint/Cartilage Comminuted fracture involving the anterior aspect of the radial head and neck. Dominant 2.8 x 1.2 cm fracture fragment which includes approximately 40% of the radial head articular surface is displaced proximally and interposed within the radiocapitellar joint (series 1007, image 28). The overall alignment of the radiocapitellar joint is near anatomic. There is an acute fracture of the more posterior aspect of the capitellum with a 1.6 x 0.7 cm minimally displaced fracture fragment (series 11, image 65). Alignment of the ulna and trochlea is anatomic without dislocation. No fracture is visualized of the proximal ulna. There is a large elbow hemarthrosis with a few tiny intra-articular fracture fragments. Ligaments Suboptimally assessed by CT. Muscles and Tendons Triceps tendon and biceps tendons appear grossly intact. No focal muscular abnormality. Soft tissues Prominent soft tissue swelling and hemorrhage at the posterior aspect of the elbow. IMPRESSION: 1. Comminuted fracture involving the anterior aspect of the radial head and neck. Dominant 2.8 x 1.2 cm fracture fragment which includes approximately 40% of the radial head articular surface is displaced proximally and interposed within the radiocapitellar joint. 2. Acute fracture of the more posterior aspect of the capitellum with a 1.6 x 0.7 cm minimally displaced fracture fragment. 3. Large elbow hemarthrosis with a few tiny intra-articular fracture fragments. 4. Prominent soft tissue swelling and hemorrhage at the posterior aspect of the  elbow. Electronically Signed   By: Duanne GuessNicholas  Plundo M.D.   On: 03/09/2019 11:04   CT 3D INDEPENDENT WKST  Result Date: 03/09/2019 CLINICAL DATA:  Left elbow fracture EXAM: CT OF THE UPPER LEFT EXTREMITY WITHOUT CONTRAST TECHNIQUE: Multidetector CT  imaging of the upper left extremity was performed according to the standard protocol. 3D reformatted images were created on an independent workstation. COMPARISON:  None available. FINDINGS: Bones/Joint/Cartilage Comminuted fracture involving the anterior aspect of the radial head and neck. Dominant 2.8 x 1.2 cm fracture fragment which includes approximately 40% of the radial head articular surface is displaced proximally and interposed within the radiocapitellar joint (series 1007, image 28). The overall alignment of the radiocapitellar joint is near anatomic. There is an acute fracture of the more posterior aspect of the capitellum with a 1.6 x 0.7 cm minimally displaced fracture fragment (series 11, image 65). Alignment of the ulna and trochlea is anatomic without dislocation. No fracture is visualized of the proximal ulna. There is a large elbow hemarthrosis with a few tiny intra-articular fracture fragments. Ligaments Suboptimally assessed by CT. Muscles and Tendons Triceps tendon and biceps tendons appear grossly intact. No focal muscular abnormality. Soft tissues Prominent soft tissue swelling and hemorrhage at the posterior aspect of the elbow. IMPRESSION: 1. Comminuted fracture involving the anterior aspect of the radial head and neck. Dominant 2.8 x 1.2 cm fracture fragment which includes approximately 40% of the radial head articular surface is displaced proximally and interposed within the radiocapitellar joint. 2. Acute fracture of the more posterior aspect of the capitellum with a 1.6 x 0.7 cm minimally displaced fracture fragment. 3. Large elbow hemarthrosis with a few tiny intra-articular fracture fragments. 4. Prominent soft tissue swelling and hemorrhage at the posterior aspect of the elbow. Electronically Signed   By: Duanne GuessNicholas  Plundo M.D.   On: 03/09/2019 11:04   DG CHEST PORT 1 VIEW  Result Date: 03/11/2019 CLINICAL DATA:  43 year old male with a history of possible infection EXAM: PORTABLE  CHEST 1 VIEW COMPARISON:  03/10/2019 FINDINGS: Cardiomediastinal silhouette unchanged with cardiomegaly. Low lung volumes. Mixed interstitial and airspace disease of the right lung greater than left lung, similar to the comparison. No pneumothorax or pleural effusion. Endotracheal tube terminates 3.8 cm above the carina. IMPRESSION: Endotracheal tube terminates 3.8 cm above the carina. Low lung volumes with right greater than left mixed interstitial and airspace disease. Cardiomegaly Electronically Signed   By: Gilmer MorJaime  Wagner D.O.   On: 03/11/2019 09:38   DG CHEST PORT 1 VIEW  Result Date: 03/11/2019 CLINICAL DATA:  Evaluate endotracheal tube EXAM: PORTABLE CHEST 1 VIEW COMPARISON:  Film from earlier in the same day. FINDINGS: The endotracheal tube has been withdrawn and now lies within the trachea in satisfactory position. The lungs again demonstrate diffuse bilateral airspace opacities. These changes are likely related to edema although the possibility of aspiration deserves consideration given the recent surgery. These changes are stable from the prior exam. IMPRESSION: Endotracheal tube in satisfactory position. Bilateral airspace opacities likely representing edema or possibly aspiration. These are unchanged from the prior study. Electronically Signed   By: Alcide CleverMark  Lukens M.D.   On: 03/11/2019 00:17   DG Chest Portable 1 View  Result Date: 03/10/2019 CLINICAL DATA:  Pulmonary edema EXAM: PORTABLE CHEST 1 VIEW COMPARISON:  November 17, 2014 FINDINGS: The endotracheal tube terminates in the right mainstem bronchus. There are bilateral hazy airspace opacities, greatest within the right upper lobe. The lung volumes are low. The heart size is mildly enlarged.  There appears to be a possible small right-sided pleural effusion. IMPRESSION: 1. Right mainstem bronchus intubation. Repositioning is recommended. 2. Diffuse bilateral hazy airspace opacities. These are of unknown clinical significance and may be  secondary to developing pulmonary edema or aspiration. A component of the findings may be secondary to the right mainstem bronchus intubation. Short interval repeat radiograph is recommended. These results will be called to the ordering clinician or representative by the Radiologist Assistant, and communication documented in the PACS or zVision Dashboard. Electronically Signed   By: Constance Holster M.D.   On: 03/10/2019 20:20   ECHOCARDIOGRAM COMPLETE  Result Date: 03/11/2019   ECHOCARDIOGRAM REPORT   Patient Name:   ISAC LINCKS Date of Exam: 03/11/2019 Medical Rec #:  [id number]      Height:       71.0 in Accession #:    [id number]     Weight:       256.2 lb Date of Birth:  1975/11/01      BSA:          2.34 m Patient Age:    40 years       BP:           124/74 mmHg Patient Gender: M              HR:           122 bpm. Exam Location:  Inpatient Procedure: 2D Echo Indications:    Pulmonary edema [[id number]]  History:        Patient has no prior history of Echocardiogram examinations.                 Risk Factors:Current Smoker. Respiratory decompensation,                 Respiratory Failure, Left Elbow Fracture.  Sonographer:    Leavy Cella Referring Phys: [id number] [place]  1. Left ventricular ejection fraction, by visual estimation, is 60 to 65%. The left ventricle has normal function. There is mildly increased left ventricular hypertrophy.  2. The left ventricle has no regional wall motion abnormalities.  3. Global right ventricle has normal systolic function.The right ventricular size is normal. No increase in right ventricular wall thickness.  4. Left atrial size was normal.  5. Right atrial size was normal.  6. The mitral valve is normal in structure. No evidence of mitral valve regurgitation. No evidence of mitral stenosis.  7. The tricuspid valve is normal in structure. Tricuspid valve regurgitation is not demonstrated.  8. The aortic valve is normal in structure. Aortic valve  regurgitation is not visualized. No evidence of aortic valve sclerosis or stenosis.  9. The pulmonic valve was normal in structure. Pulmonic valve regurgitation is not visualized. 10. The inferior vena cava is dilated in size with >50% respiratory variability, suggesting right atrial pressure of 8 mmHg. FINDINGS  Left Ventricle: Left ventricular ejection fraction, by visual estimation, is 60 to 65%. The left ventricle has normal function. The left ventricle has no regional wall motion abnormalities. There is mildly increased left ventricular hypertrophy. Left ventricular diastolic parameters were normal. Normal left atrial pressure. Right Ventricle: The right ventricular size is normal. No increase in right ventricular wall thickness. Global RV systolic function is has normal systolic function. Left Atrium: Left atrial size was normal in size. Right Atrium: Right atrial size was normal in size Pericardium: There is no evidence of pericardial effusion. Mitral Valve: The mitral valve is normal in structure. No evidence  of mitral valve regurgitation. No evidence of mitral valve stenosis by observation. Tricuspid Valve: The tricuspid valve is normal in structure. Tricuspid valve regurgitation is not demonstrated. Aortic Valve: The aortic valve is normal in structure. Aortic valve regurgitation is not visualized. The aortic valve is structurally normal, with no evidence of sclerosis or stenosis. Pulmonic Valve: The pulmonic valve was normal in structure. Pulmonic valve regurgitation is not visualized. Pulmonic regurgitation is not visualized. Aorta: The aortic root, ascending aorta and aortic arch are all structurally normal, with no evidence of dilitation or obstruction. Venous: The inferior vena cava is dilated in size with greater than 50% respiratory variability, suggesting right atrial pressure of 8 mmHg. IAS/Shunts: No atrial level shunt detected by color flow Doppler. There is no evidence of a patent foramen  ovale. No ventricular septal defect is seen or detected. There is no evidence of an atrial septal defect.  LEFT VENTRICLE PLAX 2D LVIDd:         4.00 cm  Diastology LVIDs:         3.20 cm  LV e' lateral:   18.50 cm/s LV PW:         1.30 cm  LV E/e' lateral: 4.7 LV IVS:        1.20 cm  LV e' medial:    16.50 cm/s LVOT diam:     2.40 cm  LV E/e' medial:  5.2 LV SV:         29 ml LV SV Index:   11.83 LVOT Area:     4.52 cm  RIGHT VENTRICLE RV S prime:     19.80 cm/s TAPSE (M-mode): 3.8 cm LEFT ATRIUM             Index       RIGHT ATRIUM           Index LA diam:        2.90 cm 1.24 cm/m  RA Area:     12.60 cm LA Vol (A2C):   49.6 ml 21.17 ml/m RA Volume:   30.90 ml  13.19 ml/m LA Vol (A4C):   53.8 ml 22.97 ml/m LA Biplane Vol: 54.5 ml 23.27 ml/m   AORTA Ao Root diam: 3.40 cm MITRAL VALVE MV Area (PHT): 4.63 cm             SHUNTS MV PHT:        47.56 msec           Systemic Diam: 2.40 cm MV Decel Time: 164 msec MV E velocity: 86.50 cm/s 103 cm/s MV A velocity: 82.70 cm/s 70.3 cm/s MV E/A ratio:  1.05       1.5  Zoila Shutter MD Electronically signed by Zoila Shutter MD Signature Date/Time: 03/11/2019/3:22:30 PM    Final     They benefited maximally from their hospital stay and there were no complications.     Disposition: Discharge disposition: 01-Home or Self Care      Discharge Instructions    Call MD / Call 911   Complete by: As directed    If you experience chest pain or shortness of breath, CALL 911 and be transported to the hospital emergency room.  If you develope a fever above 101 F, pus (white drainage) or increased drainage or redness at the wound, or calf pain, call your surgeon's office.   Constipation Prevention   Complete by: As directed    Drink plenty of fluids.  Prune juice may be helpful.  You may use  a stool softener, such as Colace (over the counter) 100 mg twice a day.  Use MiraLax (over the counter) for constipation as needed.   Diet - low sodium heart healthy   Complete  by: As directed    Increase activity slowly as tolerated   Complete by: As directed      Follow-up Information    Dominica Severin, MD Follow up in 14 day(s).   Specialty: Orthopedic Surgery Why: Please come to Dr. Carlos Levering office Monday at 1015am March 27, 2019 Contact information: [address] [place] [address] [place] [address] [phone]          Patient looks quite well at this juncture.  He is off O2.  He is breathing normally.  He has no shortness of breath wheeze or other problems.  He is alert and oriented.  At present juncture I do feel he looks quite well and terms of his functional capabilities about the left hand.  The pulmonary edema issues have resolved.  I discussed this with pulmonary critical care team and with anesthesia.  At present time we will plan for discharge today with follow-up in our office on 28 December as outlined.  All questions have been addressed.  Please see discharge summary.  At the time of discharge he has no signs of DVT infection dystrophy and is doing quite well with Ancef postoperatively and has good pain control.  His pain medicine has been dispensed and he has no evidence of instability dystrophy or infection.  If there are any problems he will call us. Patient has all of his home medicines at home.  He should be safe and stable for discharge at this point.  Signed: Dionne Ano Emrah Ariola III 03/13/2019, 8:55 AM

## 2019-03-13 NOTE — Progress Notes (Signed)
Pt was seen for mobility with room air and was able to maintain O2 sat with all effort.  He is expecting discharge home today, did not get to walk on stairs due to his hunger, had not had a meal tray and is expecting pain meds from nursing.  Reattempt for stairs tomorrow or later today   03/13/19 1200  PT Visit Information  Last PT Received On 03/13/19  Assistance Needed +1  History of Present Illness This 43 y.o male admitted for radial head arthroplasty with ligamentous repair and reconstruction due to Lt elbow fracture with subluxation and dislocation along with posterior capitellum avulsion injury with lateral ulnar collateral ligament tear.  Pt failed extubation post operatively and was admitted to ICU. Extubated 12/12.  PMH includes: MRSA, tobacco abuse  Subjective Data  Subjective states he is really hurting on LUE and has not eaten yet  Patient Stated Goal to get this pain better  Precautions  Precautions Fall  Precaution Comments watch 02  Required Braces or Orthoses Sling  Restrictions  Weight Bearing Restrictions Yes  LUE Weight Bearing NWB  Other Position/Activity Restrictions presumed based on surgery and use of sling  Pain Assessment  Pain Assessment Faces  Pain Score 8  Faces Pain Scale 8  Pain Location LUE  Pain Descriptors / Indicators Operative site guarding;Grimacing  Pain Intervention(s) Limited activity within patient's tolerance;Repositioned;Premedicated before session;Monitored during session  Cognition  Arousal/Alertness Awake/alert  Behavior During Therapy WFL for tasks assessed/performed  Overall Cognitive Status Within Functional Limits for tasks assessed  General Comments very direct comments about getting food and pain meds  Bed Mobility  General bed mobility comments up in chair when PT arrived  Transfers  Overall transfer level Modified independent  Ambulation/Gait  Ambulation/Gait assistance Min guard  Gait Distance (Feet) 150 Feet  Assistive  device None  Gait Pattern/deviations Step-through pattern;Decreased stride length;Wide base of support;Trunk flexed  General Gait Details pt was able to get to the walker witthout help  Gait velocity reduced  Gait velocity interpretation <1.31 ft/sec, indicative of household ambulator  Stairs  (deferred by pain)  Balance  Overall balance assessment Needs assistance  Sitting-balance support Feet supported  Sitting balance-Leahy Scale Fair  Standing balance support During functional activity;Single extremity supported  Standing balance-Leahy Scale Fair  General Comments  General comments (skin integrity, edema, etc.) O2 sats were 95% when pt went to walk on room air, but was  noted to maintain sats with gait on RW  PT - End of Session  Equipment Utilized During Treatment Gait belt  Activity Tolerance Patient tolerated treatment well;Treatment limited secondary to medical complications (Comment)  Patient left in chair;with call bell/phone within reach;with chair alarm set  Nurse Communication Mobility status   PT - Assessment/Plan  PT Plan Current plan remains appropriate  PT Visit Diagnosis Muscle weakness (generalized) (M62.81);Difficulty in walking, not elsewhere classified (R26.2)  PT Frequency (ACUTE ONLY) Min 3X/week  Follow Up Recommendations Home health PT;Supervision for mobility/OOB  PT equipment None recommended by PT  AM-PAC PT "6 Clicks" Mobility Outcome Measure (Version 2)  Help needed turning from your back to your side while in a flat bed without using bedrails? 3  Help needed moving from lying on your back to sitting on the side of a flat bed without using bedrails? 3  Help needed moving to and from a bed to a chair (including a wheelchair)? 3  Help needed standing up from a chair using your arms (e.g., wheelchair or bedside chair)?  3  Help needed to walk in hospital room? 3  Help needed climbing 3-5 steps with a railing?  3  6 Click Score 18  Consider Recommendation  of Discharge To: Home with HH  PT Goal Progression  Progress towards PT goals Progressing toward goals  Acute Rehab PT Goals  PT Goal Formulation With patient  PT Time Calculation  PT Start Time (ACUTE ONLY) 1015  PT Stop Time (ACUTE ONLY) 1039  PT Time Calculation (min) (ACUTE ONLY) 24 min  PT General Charges  $$ ACUTE PT VISIT 1 Visit  PT Treatments  $Gait Training 8-22 mins  $Therapeutic Activity 8-22 mins    Mee Hives, PT MS Acute Rehab Dept. Number: [place] [facility] [place]

## 2019-03-13 NOTE — Progress Notes (Signed)
Pt requesting a bed bath by nursing staff. Pt was instructed that even though he has a fractured elbow he would be expected to bath himself as he should be preparing for his own care after discharge.

## 2019-03-13 NOTE — Progress Notes (Signed)
Nsg Discharge Note   Geoffrey Fields will be transferred home by sister all discharged teaching was given to both. Sister reassured that family will participate in home care as needed.   Admit Date:  03/10/2019 Discharge date: 03/13/2019   Geoffrey Fields to be D/C'd Home per MD order.  AVS completed.  Copy for chart, and copy for patient signed, and dated. Patient/caregiver able to verbalize understanding.  Discharge Medication: Allergies as of 03/13/2019      Reactions   Penicillins Rash   Did it involve swelling of the face/tongue/throat, SOB, or low BP? No Did it involve sudden or severe rash/hives, skin peeling, or any reaction on the inside of your mouth or nose? No Did you need to seek medical attention at a hospital or doctor's office? No When did it last happen?Childhood If all above answers are "NO", may proceed with cephalosporin use.      Medication List    TAKE these medications   methocarbamol 500 MG tablet Commonly known as: ROBAXIN Take 500 mg by mouth every 6 (six) hours as needed for muscle spasms.   oxycodone 5 MG capsule Commonly known as: OXY-IR Take 5 mg by mouth every 4 (four) hours as needed for pain.       Discharge Assessment: Vitals:   03/12/19 2255 03/13/19 0927  BP: 119/71 (!) 143/83  Pulse: 97 93  Resp: 14 18  Temp: 97.8 F (36.6 C) 98.7 F (37.1 C)  SpO2: 93% 100%   Skin clean, dry and intact without evidence of skin break down, no evidence of skin tears noted. IV catheter discontinued intact. Site without signs and symptoms of complications - no redness or edema noted at insertion site, patient denies c/o pain - only slight tenderness at site.  Dressing with slight pressure applied.  D/c Instructions-Education: Discharge instructions given to patient/family with verbalized understanding. D/c education completed with patient/family including follow up instructions, medication list, d/c activities limitations if indicated, with other d/c  instructions as indicated by MD - patient able to verbalize understanding, all questions fully answered. Patient instructed to return to ED, call 911, or call MD for any changes in condition.  Patient escorted via [place], and D/C home via private auto.  Thana Farr, RN 03/13/2019 1:54 PM

## 2019-03-13 NOTE — Progress Notes (Signed)
   D/w CCM MD of yesterday  CCM can sign off per Dr Jenetta Downer DC plan from primary team noted  OPD followup for sleep made  Future Appointments  Date Time Provider [place]  03/16/2019  9:45 AM Laurin Coder, MD [facility] None       SIGNATURE    Dr. Brand Males, M.D., F.C.C.P,  Pulmonary and Critical Care Medicine Staff Physician, [facility] Director - Interstitial Lung Disease  Program  [facility] [place] [facility] [place], [place], [address]  Pager: [phone], If no answer or between  15:00h - 7:00h: call [phone] Telephone: [phone]  10:58 AM 03/13/2019

## 2019-03-13 NOTE — Progress Notes (Signed)
Patient ID: Geoffrey Fields, male   DOB: July 06, 1975, 43 y.o.   MRN: [id number] Patient looks quite well at this juncture.  He is off O2.  He is breathing normally.  He has no shortness of breath wheeze or other problems.  He is alert and oriented.  At present juncture I do feel he looks quite well and terms of his functional capabilities about the left hand.  The pulmonary edema issues have resolved.  I discussed this with pulmonary critical care team and with anesthesia.  At present time we will plan for discharge today with follow-up in our office on 28 December as outlined.  All questions have been addressed.  Please see discharge summary.  At the time of discharge he has no signs of DVT infection dystrophy and is doing quite well with Ancef postoperatively and has good pain control.  His pain medicine has been dispensed and he has no evidence of instability dystrophy or infection.  If there are any problems he will call us.  Jakorey Mcconathy MD

## 2019-03-13 NOTE — Progress Notes (Signed)
Occupational Therapy Treatment Patient Details Name: Geoffrey Fields MRN: [id number] DOB: Aug 07, 1975 Today's Date: 03/13/2019    History of present illness This 43 y.o male admitted for radial head arthroplasty with ligamentous repair and reconstruction due to Lt elbow fracture with subluxation and dislocation along with posterior capitellum avulsion injury with lateral ulnar collateral ligament tear.  Pt failed extubation post operatively and was admitted to ICU. Extubated 12/12.  PMH includes: MRSA, tobacco abuse   OT comments  Pt making excellent progress toward all goals in the plan of care.  Pt moving L fingers slightly more than 2 days ago.  Pt completing basic adls with very little assist.  Pt has assist at home.  Unsure if 24/7 assist is available but feel pt should be able to manage at home.  Reviewed all precautions regarding LUE.  Pt demonstrated ability to donn and doff sling.  Pt can toilet with supervision.  Future sessions to focus on UE dressing with loose fitting t-shirt, toileting with no assist and continued work with L fingers.     Follow Up Recommendations  Home health OT;Supervision/Assistance - 24 hour    Equipment Recommendations  3 in 1 bedside commode    Recommendations for Other Services      Precautions / Restrictions Precautions Required Braces or Orthoses: Sling Restrictions Weight Bearing Restrictions: Yes LUE Weight Bearing: Non weight bearing Other Position/Activity Restrictions: No WB restrictions in chart but assumed NWB       Mobility Bed Mobility               General bed mobility comments: pt in bathroom on arrival.  Transfers Overall transfer level: Needs assistance Equipment used: None Transfers: Sit to/from [employer] Transfers Sit to Stand: Supervision Stand pivot transfers: Min guard       General transfer comment: more independence with tranfers today.  Encouraged proper hand placement with RUE when sitting.     Balance Overall balance assessment: Needs assistance Sitting-balance support: Feet supported;No upper extremity supported Sitting balance-Leahy Scale: Good     Standing balance support: During functional activity;No upper extremity supported Standing balance-Leahy Scale: Fair Standing balance comment: no overt LoB when up on feet.                           ADL either performed or assessed with clinical judgement   ADL Overall ADL's : Needs assistance/impaired Eating/Feeding: Set up;Sitting   Grooming: Set up;Standing;Cueing for compensatory techniques Grooming Details (indicate cue type and reason): Pt stood at sink to groom.  Pt required VCs about how to do things one handed and how to use L hand to assist. Upper Body Bathing: Minimal assistance;Sitting Upper Body Bathing Details (indicate cue type and reason): min assist to bathe R arm. Lower Body Bathing: Minimal assistance;Sit to/from stand;Cueing for compensatory techniques Lower Body Bathing Details (indicate cue type and reason): Pt stood at sink to bathe LE and was able to do so with cues to cross legs to wash feet and assist with backside only to make sure things were clean. Upper Body Dressing : Moderate assistance;Sitting;Cueing for compensatory techniques Upper Body Dressing Details (indicate cue type and reason): instructed pt to dress operated arm first, then R arm then take loose shirt overhead.  Pt doffed sling on own and required min assist to donn sling. Lower Body Dressing: Minimal assistance;Sit to/from stand;Cueing for compensatory techniques Lower Body Dressing Details (indicate cue type and reason): Pt donned socks and  doffed socks without assist.  Instructed pt how to donn pants and shorts one handed.   Toilet Transfer: Min guard;Comfort height toilet;Grab bars;Ambulation Toilet Transfer Details (indicate cue type and reason): Pt walked to bathroom to toilet Toileting- Clothing Manipulation and Hygiene:  Min guard;Sit to/from stand;Cueing for compensatory techniques Toileting - Clothing Manipulation Details (indicate cue type and reason): Pt asked for help to clean self but was able to do so with cues only for technique.     Functional mobility during ADLs: Min guard;Cueing for safety General ADL Comments: Pt more independent today with all adls and mobility. Pt states he has never been very confident so asks for help because of this.  Reviewed all things to be careful about in regards to L arm, but encouraged pt to be more independent and do some for himself.      Vision   Vision Assessment?: No apparent visual deficits   Perception     Praxis      Cognition Arousal/Alertness: Awake/alert Behavior During Therapy: WFL for tasks assessed/performed Overall Cognitive Status: Within Functional Limits for tasks assessed                                 General Comments: Pt slow to process but got better as session when on.  Pt feels that may be from pain medicine.  Just says he feels foggy.        Exercises Exercises: Other exercises Other Exercises Other Exercises: finger flexion and extension exercises performed.     Shoulder Instructions       General Comments Pt O2 sats at 96% throughout session without O2    Pertinent Vitals/ Pain       Pain Assessment: 0-10 Pain Score: 8  Pain Location: LUE Pain Descriptors / Indicators: Sore;Operative site guarding Pain Intervention(s): Limited activity within patient's tolerance;Monitored during session;Repositioned  Home Living                                          Prior Functioning/Environment              Frequency  Min 2X/week        Progress Toward Goals  OT Goals(current goals can now be found in the care plan section)  Progress towards OT goals: Progressing toward goals  Acute Rehab OT Goals Patient Stated Goal: to get this pain better OT Goal Formulation: With patient Time  For Goal Achievement: 03/25/19 Potential to Achieve Goals: Good ADL Goals Pt Will Perform Grooming: with modified independence;sitting Pt Will Perform Upper Body Bathing: with min assist;sitting Pt Will Perform Upper Body Dressing: with min assist;sitting Pt Will Perform Lower Body Dressing: with min assist;sit to/from stand;with adaptive equipment Pt Will Transfer to Toilet: ambulating;with modified independence Pt Will Perform Toileting - Clothing Manipulation and hygiene: with modified independence;sit to/from stand Pt/caregiver will Perform Home Exercise Program: Increased ROM;Left upper extremity;With written HEP provided Additional ADL Goal #1: Pt will demonstrate sling mgmt with independence or be able to instruct caregiver with independence.  Plan Discharge plan remains appropriate    Co-evaluation                 AM-PAC OT "6 Clicks" Daily Activity     Outcome Measure   Help from another person eating meals?: A Little Help from another person  taking care of personal grooming?: A Little Help from another person toileting, which includes using toliet, bedpan, or urinal?: A Little Help from another person bathing (including washing, rinsing, drying)?: A Little Help from another person to put on and taking off regular upper body clothing?: A Lot Help from another person to put on and taking off regular lower body clothing?: A Lot 6 Click Score: 16    End of Session    OT Visit Diagnosis: Other abnormalities of gait and mobility (R26.89);Pain Pain - Right/Left: Left Pain - part of body: Arm   Activity Tolerance Patient tolerated treatment well   Patient Left in chair;with call bell/phone within reach;with chair alarm set;with nursing/sitter in room   Nurse Communication Mobility status        Time: 6384-6659 OT Time Calculation (min): 37 min  Charges: OT General Charges $OT Visit: 1 Visit OT Treatments $Self Care/Home Management : 8-22 mins $Therapeutic  Exercise: 8-22 mins   Hope Budds 03/13/2019, 9:34 AM

## 2019-03-15 ENCOUNTER — Encounter: Payer: Self-pay | Admitting: *Deleted

## 2019-03-16 ENCOUNTER — Institutional Professional Consult (permissible substitution): Payer: Self-pay | Admitting: Pulmonary Disease

## 2019-03-29 ENCOUNTER — Institutional Professional Consult (permissible substitution): Payer: Self-pay | Admitting: Pulmonary Disease

## 2020-10-21 IMAGING — CT CT 3D INDEPENDENT WKST
3 of 9 series · 10 of 46 positions shown, 11 images · non-contrast
Comparison: None available.

CLINICAL DATA: Left elbow fracture

EXAM:
CT OF THE UPPER LEFT EXTREMITY WITHOUT CONTRAST
TECHNIQUE: Multidetector CT imaging of the upper left extremity was performed
according to the standard protocol. 3D reformatted images were
created on an independent workstation.

[Series 7: elbow 1.50 br40 s3 axial st hd fov · axial · 0.48mm/px · z∈[-1001,-925]mm · 4 of 317 slices shown]
[im 32/317  soft-tissue]
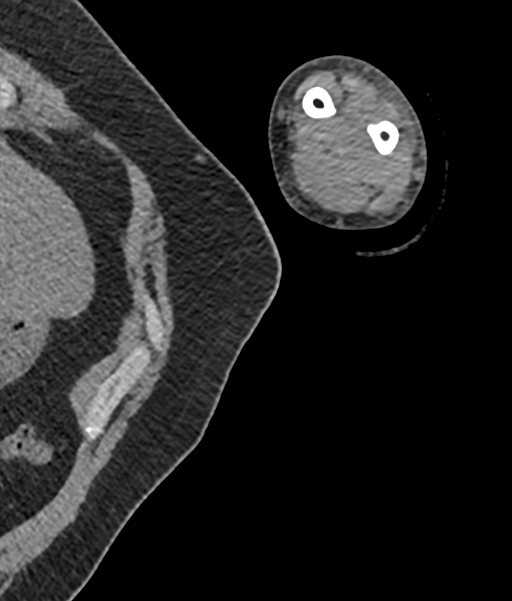
[im 64/317  soft-tissue]
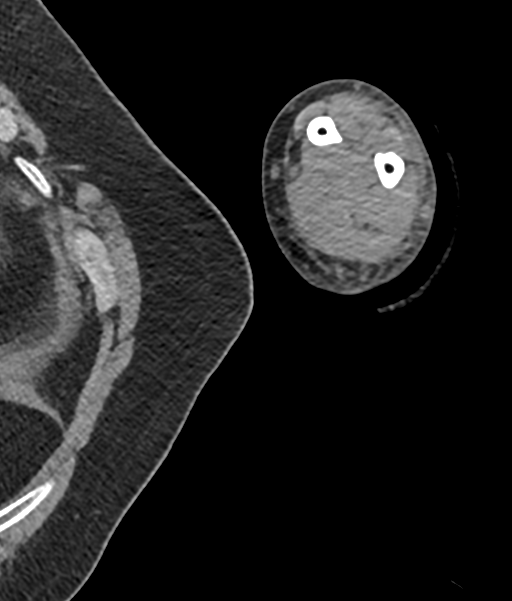
[im 95/317  soft-tissue]
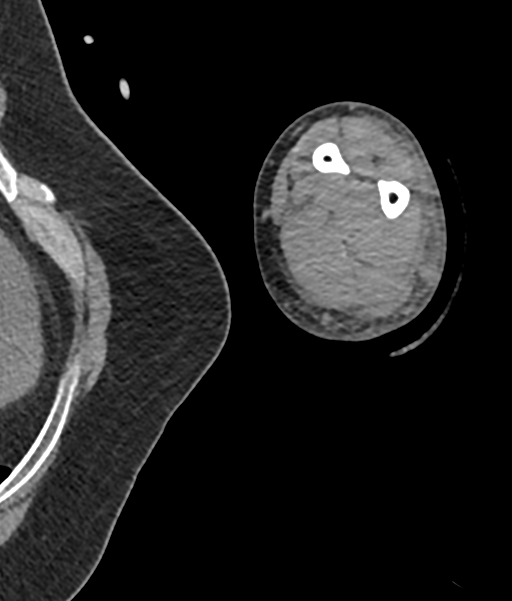
[im 127/317  soft-tissue]
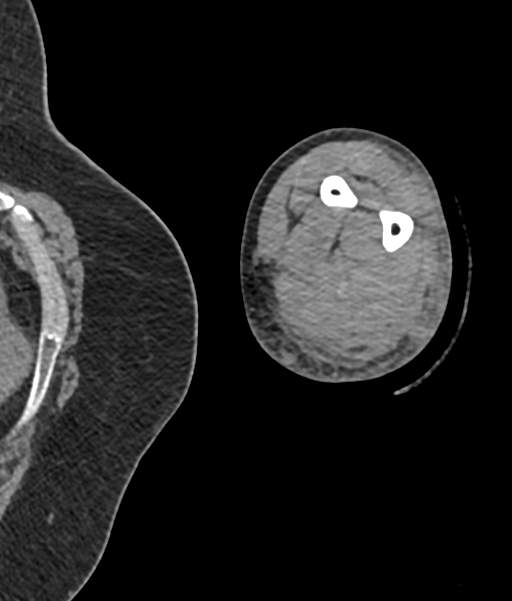

[Series 13: elbow 1.50 br40 s3 cor st hd fov · coronal · 0.47mm/px · 3 of 186 slices shown]
[im 47/186  soft-tissue]
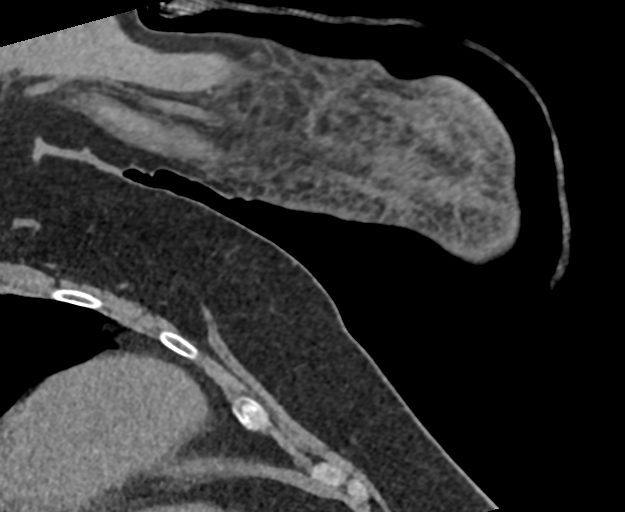
[im 93/186  soft-tissue]
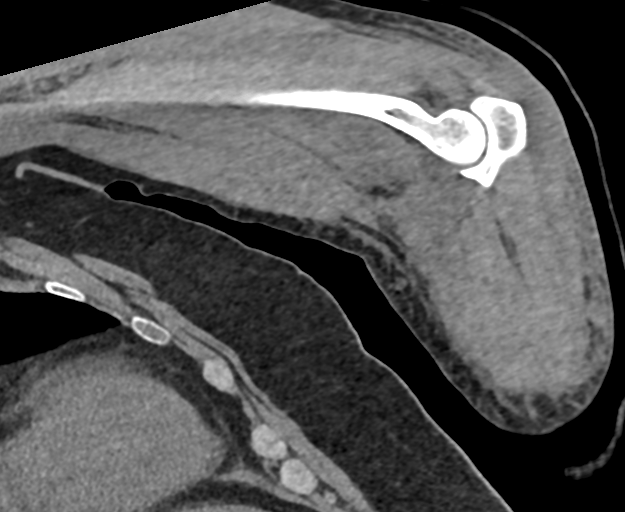
[im 139/186  soft-tissue]
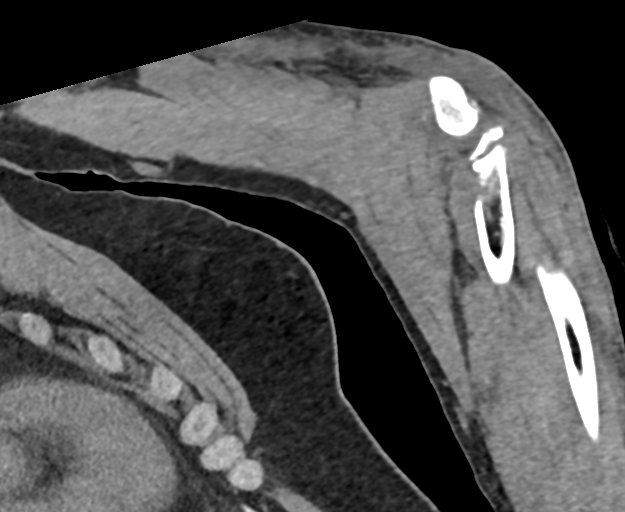

[Series 1015: mpr 2mm range(3) · axial · 0.30mm/px · z∈[-991,-440]mm · 3 of 94 slices shown, 4 images]
[im 1/94  soft-tissue]
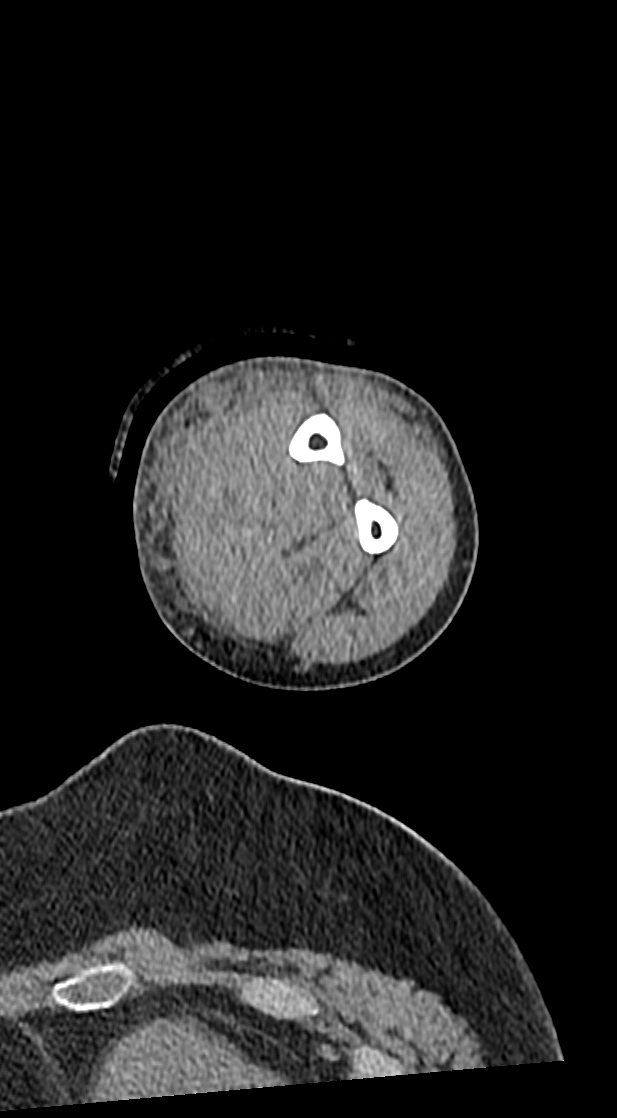
[im 1/94  bone]
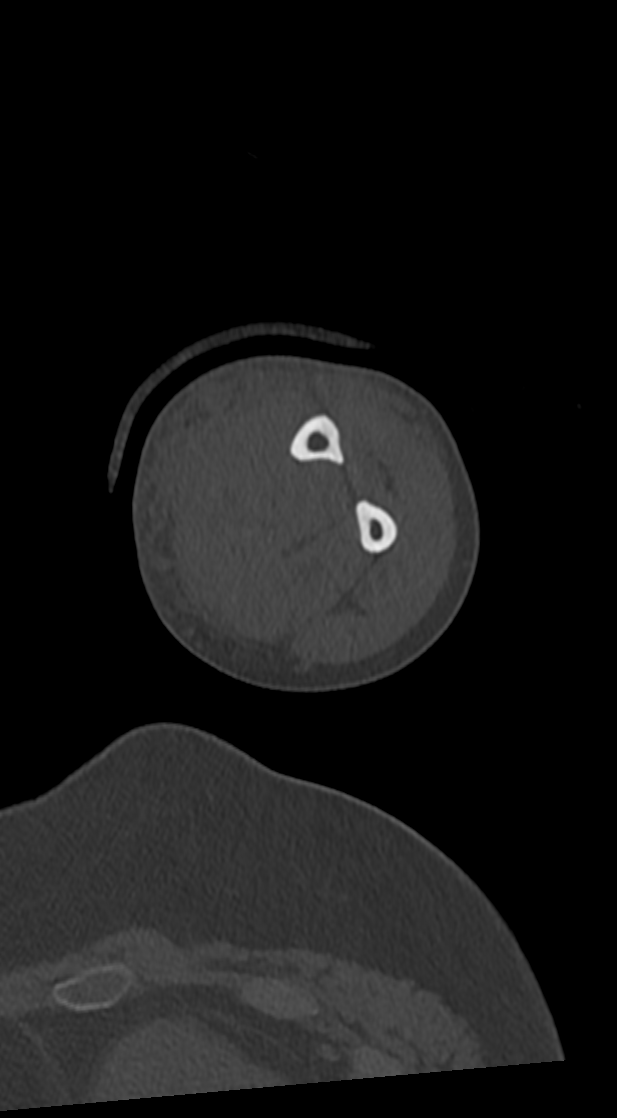
[im 47/94  soft-tissue]
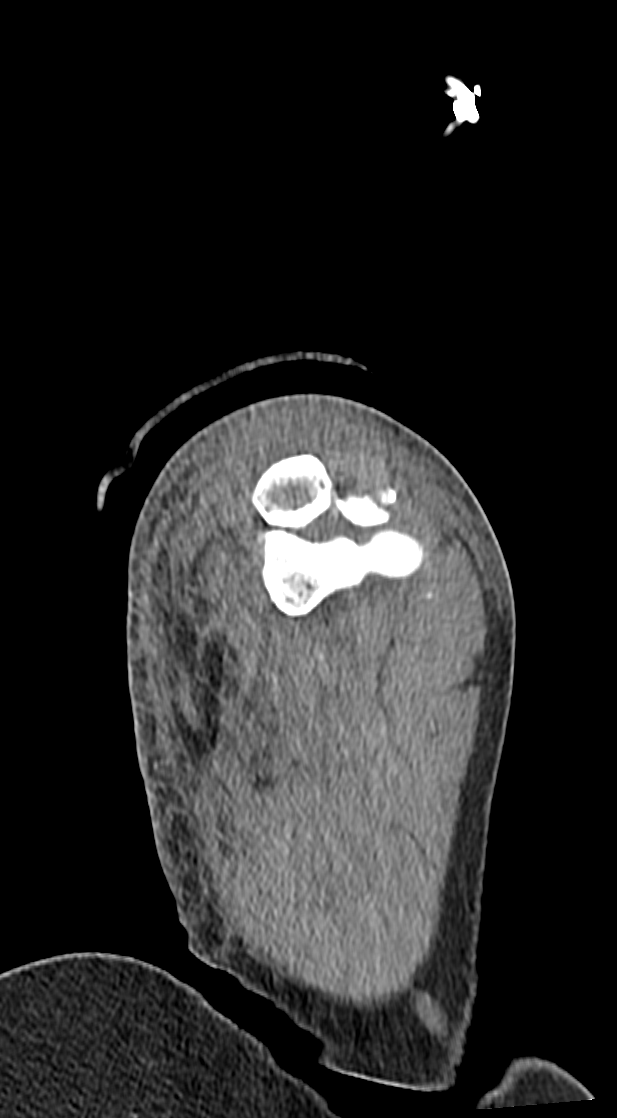
[im 94/94  soft-tissue]
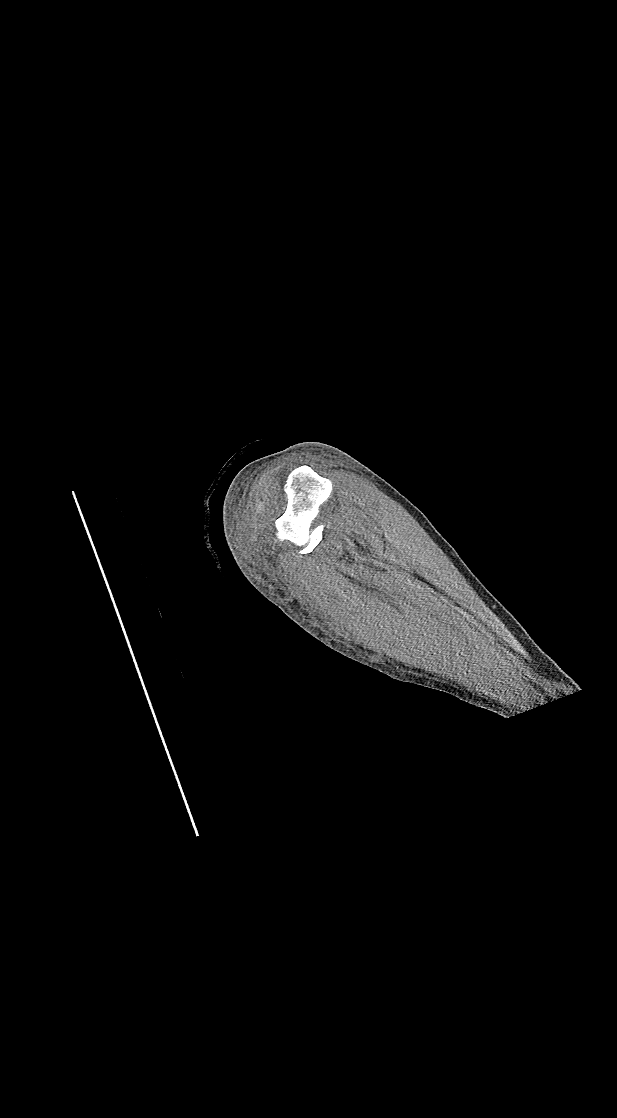

[10 of 46 positions shown; findings below may reference images not displayed]

FINDINGS: Bones/Joint/Cartilage

Comminuted fracture involving the anterior aspect of the radial head
and neck. Dominant 2.8 x 1.2 cm fracture fragment which includes
approximately 40% of the radial head articular surface is displaced
proximally and interposed within the radiocapitellar joint (series
0442, image 28). The overall alignment of the radiocapitellar joint
is near anatomic. There is an acute fracture of the more posterior
aspect of the capitellum with a 1.6 x 0.7 cm minimally displaced
fracture fragment (series 11, image 65).

Alignment of the ulna and trochlea is anatomic without dislocation.
No fracture is visualized of the proximal ulna.

There is a large elbow hemarthrosis with a few tiny intra-articular
fracture fragments.

Ligaments

Suboptimally assessed by CT.

Muscles and Tendons

Triceps tendon and biceps tendons appear grossly intact. No focal
muscular abnormality.

Soft tissues

Prominent soft tissue swelling and hemorrhage at the posterior
aspect of the elbow.
IMPRESSION: 1. Comminuted fracture involving the anterior aspect of the radial
head and neck. Dominant 2.8 x 1.2 cm fracture fragment which
includes approximately 40% of the radial head articular surface is
displaced proximally and interposed within the radiocapitellar
joint.
2. Acute fracture of the more posterior aspect of the capitellum
with a 1.6 x 0.7 cm minimally displaced fracture fragment.
3. Large elbow hemarthrosis with a few tiny intra-articular fracture
fragments.
4. Prominent soft tissue swelling and hemorrhage at the posterior
aspect of the elbow.

## 2020-10-22 IMAGING — DX DG CHEST 1V PORT
1 series · 1 of 1 positions shown · non-contrast
Comparison: Film from earlier in the same day.

CLINICAL DATA: Evaluate endotracheal tube

EXAM:
PORTABLE CHEST 1 VIEW

[chest]
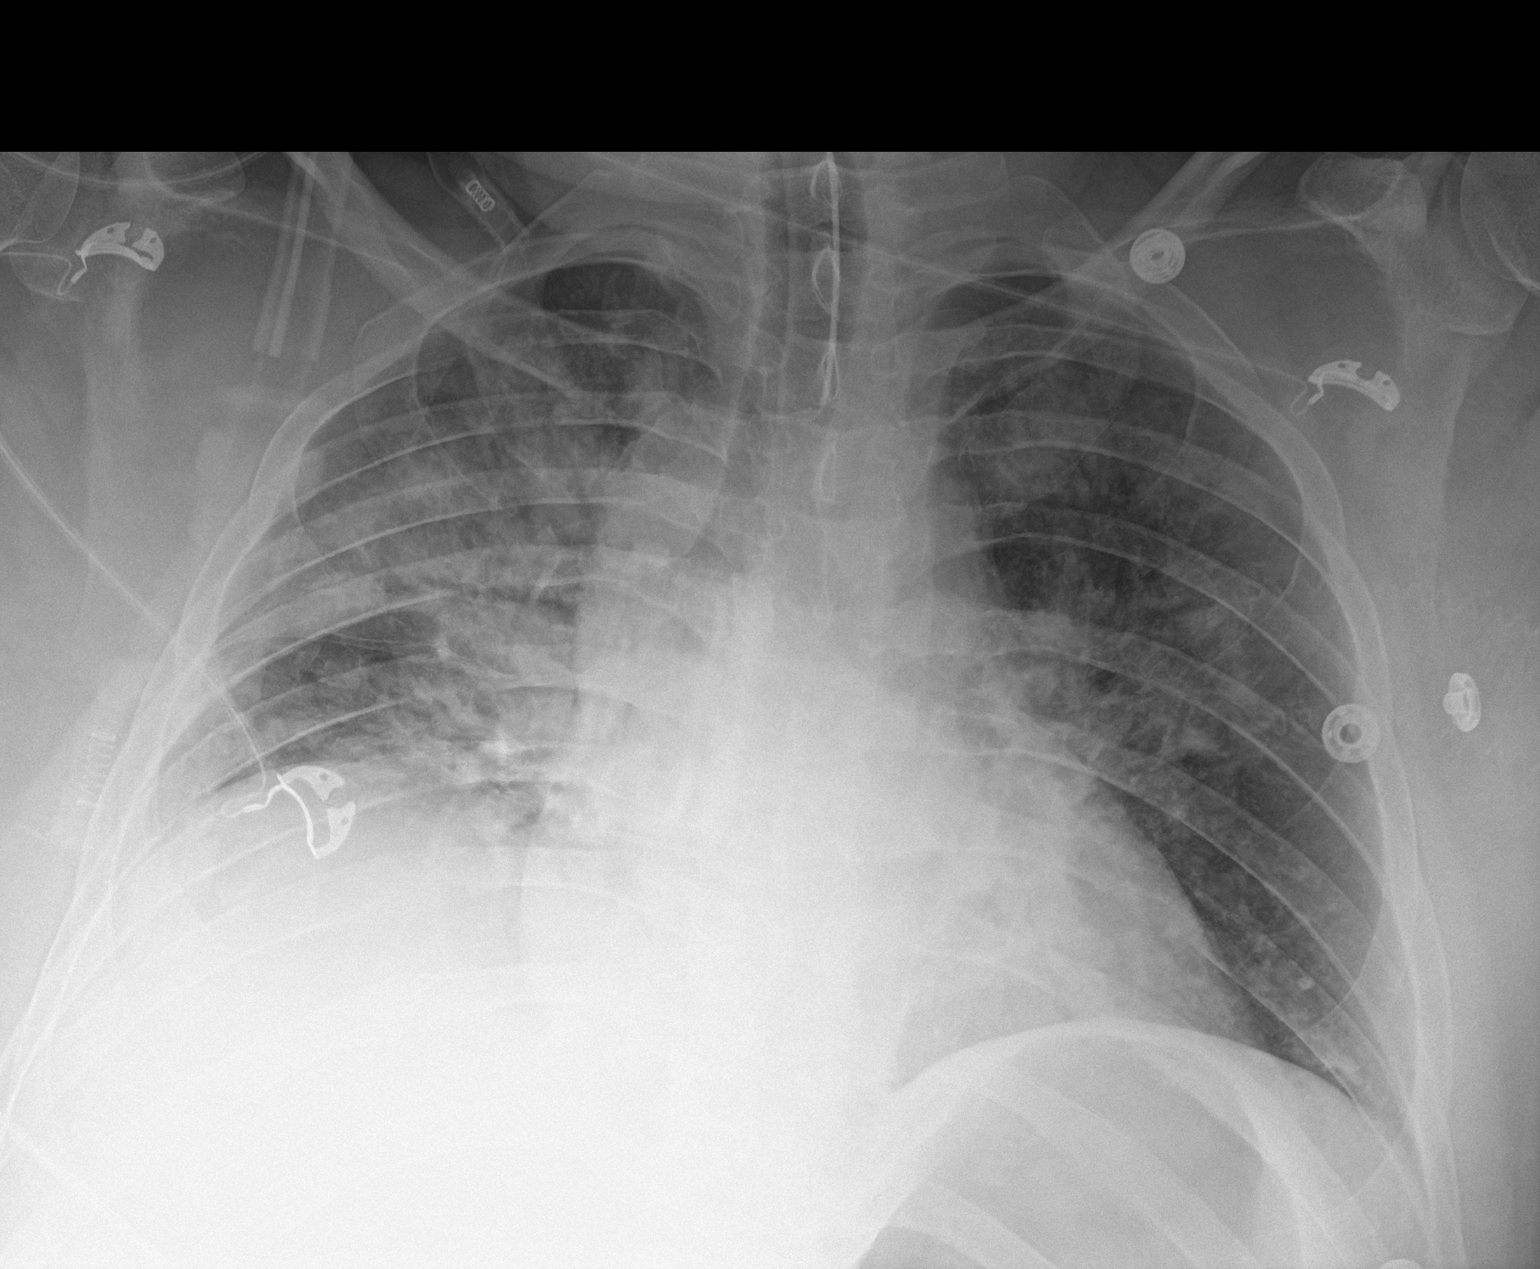

[1 of 1 positions shown; findings below may reference images not displayed]

FINDINGS: The endotracheal tube has been withdrawn and now lies within the
trachea in satisfactory position. The lungs again demonstrate
diffuse bilateral airspace opacities. These changes are likely
related to edema although the possibility of aspiration deserves
consideration given the recent surgery. These changes are stable
from the prior exam.
IMPRESSION: Endotracheal tube in satisfactory position.

Bilateral airspace opacities likely representing edema or possibly
aspiration. These are unchanged from the prior study.

## 2020-10-23 IMAGING — DX DG CHEST 1V PORT
1 series · 1 of 1 positions shown · non-contrast
Comparison: 03/10/2019

CLINICAL DATA: 43-year-old male with a history of possible
infection

EXAM:
PORTABLE CHEST 1 VIEW

[chest ap]
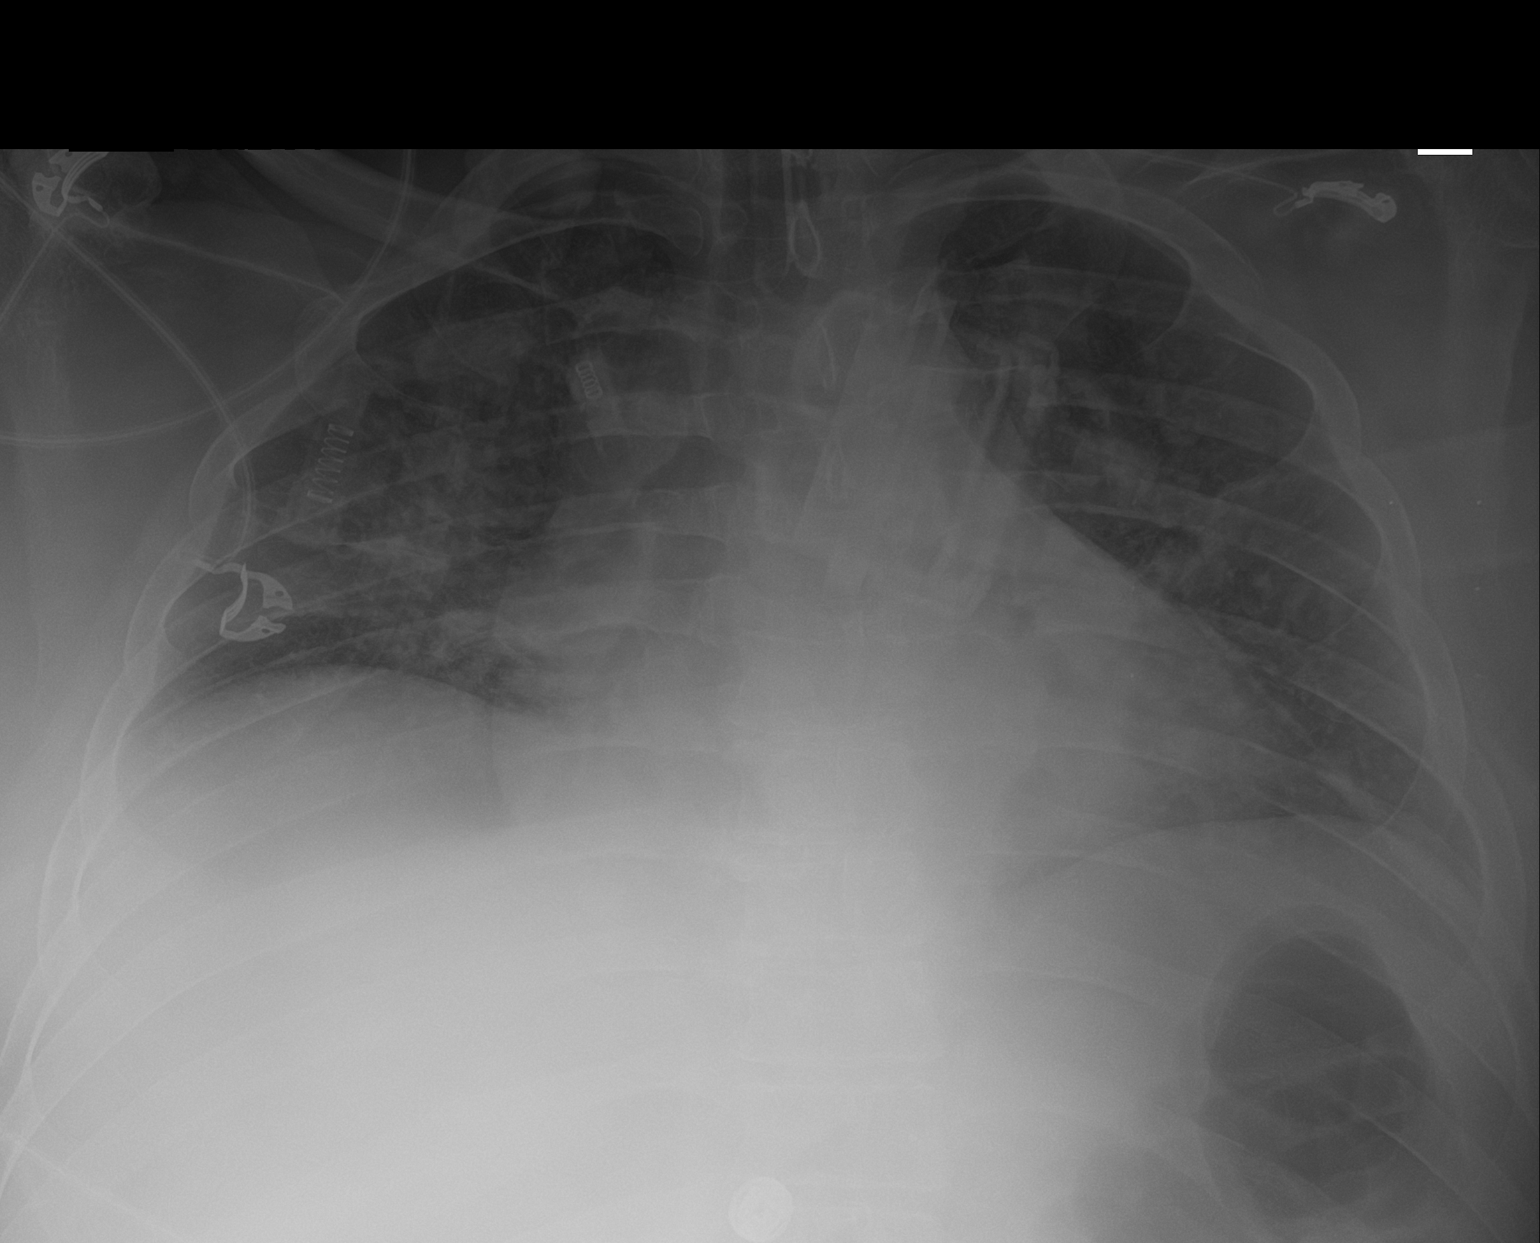

[1 of 1 positions shown; findings below may reference images not displayed]

FINDINGS: Cardiomediastinal silhouette unchanged with cardiomegaly. Low lung
volumes.

Mixed interstitial and airspace disease of the right lung greater
than left lung, similar to the comparison. No pneumothorax or
pleural effusion.

Endotracheal tube terminates 3.8 cm above the carina.
IMPRESSION: Endotracheal tube terminates 3.8 cm above the carina.

Low lung volumes with right greater than left mixed interstitial and
airspace disease.

Cardiomegaly
